# Patient Record
Sex: Male | Born: 1960 | Race: White | Hispanic: No | Marital: Single | Smoking: Current every day smoker
Health system: Southern US, Community
[De-identification: ages and names within clinical notes are randomized; demographics above are authoritative.]

## PROBLEM LIST (undated history)

## (undated) DIAGNOSIS — I519 Heart disease, unspecified: Secondary | ICD-10-CM

## (undated) DIAGNOSIS — Z951 Presence of aortocoronary bypass graft: Secondary | ICD-10-CM

## (undated) DIAGNOSIS — I2510 Atherosclerotic heart disease of native coronary artery without angina pectoris: Secondary | ICD-10-CM

## (undated) DIAGNOSIS — F1410 Cocaine abuse, uncomplicated: Secondary | ICD-10-CM

## (undated) HISTORY — PX: CORONARY STENT PLACEMENT: SHX1402

---

## 2005-04-12 ENCOUNTER — Inpatient Hospital Stay (HOSPITAL_COMMUNITY): Admission: EM | Admit: 2005-04-12 | Discharge: 2005-04-14 | Payer: Self-pay | Admitting: Emergency Medicine

## 2006-05-25 ENCOUNTER — Emergency Department (HOSPITAL_COMMUNITY): Admission: EM | Admit: 2006-05-25 | Discharge: 2006-05-26 | Payer: Self-pay | Admitting: Emergency Medicine

## 2006-08-17 ENCOUNTER — Ambulatory Visit: Payer: Self-pay | Admitting: Family Medicine

## 2006-09-07 ENCOUNTER — Ambulatory Visit: Payer: Self-pay | Admitting: Family Medicine

## 2006-09-08 ENCOUNTER — Ambulatory Visit: Payer: Self-pay | Admitting: *Deleted

## 2006-12-18 ENCOUNTER — Ambulatory Visit: Payer: Self-pay | Admitting: Family Medicine

## 2009-02-21 ENCOUNTER — Encounter: Payer: Self-pay | Admitting: Cardiothoracic Surgery

## 2009-02-21 ENCOUNTER — Emergency Department (HOSPITAL_COMMUNITY): Admission: EM | Admit: 2009-02-21 | Discharge: 2009-02-21 | Payer: Self-pay | Admitting: Family Medicine

## 2009-02-21 ENCOUNTER — Ambulatory Visit: Payer: Self-pay | Admitting: Cardiothoracic Surgery

## 2009-02-21 ENCOUNTER — Inpatient Hospital Stay (HOSPITAL_COMMUNITY): Admission: EM | Admit: 2009-02-21 | Discharge: 2009-02-28 | Payer: Self-pay | Admitting: Cardiology

## 2009-02-22 ENCOUNTER — Encounter: Payer: Self-pay | Admitting: Cardiothoracic Surgery

## 2009-03-16 ENCOUNTER — Ambulatory Visit: Payer: Self-pay | Admitting: Cardiothoracic Surgery

## 2009-03-16 ENCOUNTER — Encounter: Admission: RE | Admit: 2009-03-16 | Discharge: 2009-03-16 | Payer: Self-pay | Admitting: Cardiothoracic Surgery

## 2009-03-21 ENCOUNTER — Ambulatory Visit: Payer: Self-pay | Admitting: Internal Medicine

## 2009-04-05 ENCOUNTER — Encounter (INDEPENDENT_AMBULATORY_CARE_PROVIDER_SITE_OTHER): Payer: Self-pay | Admitting: Adult Health

## 2009-04-05 ENCOUNTER — Ambulatory Visit: Payer: Self-pay | Admitting: Internal Medicine

## 2009-04-05 LAB — CONVERTED CEMR LAB
ALT: 9 units/L (ref 0–53)
AST: 13 units/L (ref 0–37)
Albumin: 4.4 g/dL (ref 3.5–5.2)
Alkaline Phosphatase: 62 units/L (ref 39–117)
Basophils Absolute: 0 10*3/uL (ref 0.0–0.1)
HDL: 50 mg/dL (ref >39)
LDL Cholesterol: 58 mg/dL (ref 0–99)
Lymphocytes Relative: 22 % (ref 12–46)
Neutro Abs: 4.8 10*3/uL (ref 1.7–7.7)
Platelets: 301 10*3/uL (ref 150–400)
Potassium: 4.6 meq/L (ref 3.5–5.3)
RDW: 13.7 % (ref 11.5–15.5)
Sodium: 141 meq/L (ref 135–145)
Total Protein: 7.4 g/dL (ref 6.0–8.3)
WBC: 7.4 10*3/uL (ref 4.0–10.5)

## 2009-04-12 ENCOUNTER — Encounter (HOSPITAL_COMMUNITY): Admission: RE | Admit: 2009-04-12 | Discharge: 2009-04-27 | Payer: Self-pay | Admitting: Cardiology

## 2009-04-28 ENCOUNTER — Encounter (HOSPITAL_COMMUNITY): Admission: RE | Admit: 2009-04-28 | Discharge: 2009-07-27 | Payer: Self-pay | Admitting: Cardiology

## 2009-05-30 ENCOUNTER — Encounter (INDEPENDENT_AMBULATORY_CARE_PROVIDER_SITE_OTHER): Payer: Self-pay | Admitting: Cardiology

## 2009-05-30 ENCOUNTER — Ambulatory Visit (HOSPITAL_COMMUNITY): Admission: RE | Admit: 2009-05-30 | Discharge: 2009-05-30 | Payer: Self-pay | Admitting: Cardiology

## 2009-06-08 ENCOUNTER — Emergency Department (HOSPITAL_COMMUNITY)
Admission: EM | Admit: 2009-06-08 | Discharge: 2009-06-08 | Payer: Self-pay | Source: Home / Self Care | Admitting: Emergency Medicine

## 2009-06-21 ENCOUNTER — Ambulatory Visit: Payer: Self-pay | Admitting: Internal Medicine

## 2009-07-04 ENCOUNTER — Encounter: Admission: RE | Admit: 2009-07-04 | Discharge: 2009-10-02 | Payer: Self-pay | Admitting: Orthopedic Surgery

## 2009-08-22 ENCOUNTER — Encounter: Admission: RE | Admit: 2009-08-22 | Discharge: 2009-11-20 | Payer: Self-pay | Admitting: Orthopedic Surgery

## 2010-02-07 ENCOUNTER — Emergency Department (HOSPITAL_COMMUNITY): Admission: EM | Admit: 2010-02-07 | Discharge: 2010-02-07 | Payer: Self-pay | Admitting: Emergency Medicine

## 2010-07-11 LAB — RAPID URINE DRUG SCREEN, HOSP PERFORMED
Amphetamines: NOT DETECTED
Barbiturates: NOT DETECTED
Benzodiazepines: NOT DETECTED
Cocaine: POSITIVE — AB
Opiates: NOT DETECTED
Tetrahydrocannabinol: NOT DETECTED

## 2010-07-31 LAB — COMPREHENSIVE METABOLIC PANEL
ALT: 51 U/L (ref 0–53)
AST: 49 U/L — ABNORMAL HIGH (ref 0–37)
Albumin: 2.3 g/dL — ABNORMAL LOW (ref 3.5–5.2)
Alkaline Phosphatase: 85 U/L (ref 39–117)
BUN: 14 mg/dL (ref 6–23)
CO2: 29 mEq/L (ref 19–32)
Calcium: 7.9 mg/dL — ABNORMAL LOW (ref 8.4–10.5)
Chloride: 101 mEq/L (ref 96–112)
Creatinine, Ser: 0.79 mg/dL (ref 0.4–1.5)
GFR calc Af Amer: >60 mL/min (ref >60)
GFR calc non Af Amer: >60 mL/min (ref >60)
Glucose, Bld: 103 mg/dL — ABNORMAL HIGH (ref 70–99)
Potassium: 4.2 mEq/L (ref 3.5–5.1)
Sodium: 135 mEq/L (ref 135–145)
Total Bilirubin: 0.5 mg/dL (ref 0.3–1.2)
Total Protein: 5.5 g/dL — ABNORMAL LOW (ref 6.0–8.3)

## 2010-07-31 LAB — CBC
HCT: 26.5 % — ABNORMAL LOW (ref 39.0–52.0)
HCT: 26.8 % — ABNORMAL LOW (ref 39.0–52.0)
Hemoglobin: 9.2 g/dL — ABNORMAL LOW (ref 13.0–17.0)
Hemoglobin: 9.4 g/dL — ABNORMAL LOW (ref 13.0–17.0)
MCHC: 34.6 g/dL (ref 30.0–36.0)
MCHC: 34.8 g/dL (ref 30.0–36.0)
MCV: 89.5 fL (ref 78.0–100.0)
MCV: 90.3 fL (ref 78.0–100.0)
Platelets: 211 10*3/uL (ref 150–400)
Platelets: 250 10*3/uL (ref 150–400)
RBC: 2.96 MIL/uL — ABNORMAL LOW (ref 4.22–5.81)
RBC: 2.97 MIL/uL — ABNORMAL LOW (ref 4.22–5.81)
RDW: 13.5 % (ref 11.5–15.5)
RDW: 13.6 % (ref 11.5–15.5)
WBC: 10.6 10*3/uL — ABNORMAL HIGH (ref 4.0–10.5)
WBC: 9.9 10*3/uL (ref 4.0–10.5)

## 2010-07-31 LAB — BASIC METABOLIC PANEL
BUN: 15 mg/dL (ref 6–23)
CO2: 28 mEq/L (ref 19–32)
Calcium: 7.9 mg/dL — ABNORMAL LOW (ref 8.4–10.5)
Chloride: 102 mEq/L (ref 96–112)
Creatinine, Ser: 0.91 mg/dL (ref 0.4–1.5)
GFR calc Af Amer: >60 mL/min (ref >60)
GFR calc non Af Amer: >60 mL/min (ref >60)
Glucose, Bld: 107 mg/dL — ABNORMAL HIGH (ref 70–99)
Potassium: 3.7 mEq/L (ref 3.5–5.1)
Sodium: 137 mEq/L (ref 135–145)

## 2010-08-01 LAB — CARDIAC PANEL(CRET KIN+CKTOT+MB+TROPI)
CK, MB: 100.3 ng/mL — ABNORMAL HIGH (ref 0.3–4.0)
CK, MB: 233.5 ng/mL — ABNORMAL HIGH (ref 0.3–4.0)
CK, MB: 474.3 ng/mL — ABNORMAL HIGH (ref 0.3–4.0)
CK, MB: 73.1 ng/mL — ABNORMAL HIGH (ref 0.3–4.0)
Relative Index: 5.2 — ABNORMAL HIGH (ref 0.0–2.5)
Relative Index: 5.9 — ABNORMAL HIGH (ref 0.0–2.5)
Relative Index: 6.6 — ABNORMAL HIGH (ref 0.0–2.5)
Relative Index: 7.4 — ABNORMAL HIGH (ref 0.0–2.5)
Total CK: 1232 U/L — ABNORMAL HIGH (ref 7–232)
Total CK: 1921 U/L — ABNORMAL HIGH (ref 7–232)
Total CK: 3154 U/L — ABNORMAL HIGH (ref 7–232)
Total CK: 7207 U/L — ABNORMAL HIGH (ref 7–232)
Troponin I: 87.58 ng/mL (ref 0.00–0.06)
Troponin I: >100 ng/mL (ref 0.00–0.06)
Troponin I: >100 ng/mL (ref 0.00–0.06)
Troponin I: >101 ng/mL (ref 0.00–0.06)

## 2010-08-01 LAB — BASIC METABOLIC PANEL
BUN: 11 mg/dL (ref 6–23)
BUN: 12 mg/dL (ref 6–23)
BUN: 9 mg/dL (ref 6–23)
CO2: 22 mEq/L (ref 19–32)
CO2: 25 mEq/L (ref 19–32)
CO2: 29 mEq/L (ref 19–32)
Calcium: 7.1 mg/dL — ABNORMAL LOW (ref 8.4–10.5)
Calcium: 7.9 mg/dL — ABNORMAL LOW (ref 8.4–10.5)
Calcium: 8.2 mg/dL — ABNORMAL LOW (ref 8.4–10.5)
Chloride: 100 mEq/L (ref 96–112)
Chloride: 102 mEq/L (ref 96–112)
Chloride: 102 mEq/L (ref 96–112)
Creatinine, Ser: 0.67 mg/dL (ref 0.4–1.5)
Creatinine, Ser: 0.69 mg/dL (ref 0.4–1.5)
Creatinine, Ser: 0.71 mg/dL (ref 0.4–1.5)
GFR calc Af Amer: >60 mL/min (ref >60)
GFR calc Af Amer: >60 mL/min (ref >60)
GFR calc Af Amer: >60 mL/min (ref >60)
GFR calc non Af Amer: >60 mL/min (ref >60)
GFR calc non Af Amer: >60 mL/min (ref >60)
GFR calc non Af Amer: >60 mL/min (ref >60)
Glucose, Bld: 118 mg/dL — ABNORMAL HIGH (ref 70–99)
Glucose, Bld: 119 mg/dL — ABNORMAL HIGH (ref 70–99)
Glucose, Bld: 127 mg/dL — ABNORMAL HIGH (ref 70–99)
Potassium: 3.6 mEq/L (ref 3.5–5.1)
Potassium: 3.8 mEq/L (ref 3.5–5.1)
Potassium: 4.3 mEq/L (ref 3.5–5.1)
Sodium: 130 mEq/L — ABNORMAL LOW (ref 135–145)
Sodium: 133 mEq/L — ABNORMAL LOW (ref 135–145)
Sodium: 133 mEq/L — ABNORMAL LOW (ref 135–145)

## 2010-08-01 LAB — POCT I-STAT 3, ART BLOOD GAS (G3+)
Acid-Base Excess: 1 mmol/L (ref 0.0–2.0)
Acid-Base Excess: 1 mmol/L (ref 0.0–2.0)
Acid-Base Excess: 1 mmol/L (ref 0.0–2.0)
Bicarbonate: 23.5 mEq/L (ref 20.0–24.0)
Bicarbonate: 24.6 mEq/L — ABNORMAL HIGH (ref 20.0–24.0)
Bicarbonate: 25.3 mEq/L — ABNORMAL HIGH (ref 20.0–24.0)
Bicarbonate: 25.4 mEq/L — ABNORMAL HIGH (ref 20.0–24.0)
O2 Saturation: 100 %
O2 Saturation: 100 %
O2 Saturation: 98 %
O2 Saturation: 99 %
Patient temperature: 38.4
Patient temperature: 99.3
TCO2: 25 mmol/L (ref 0–100)
TCO2: 26 mmol/L (ref 0–100)
TCO2: 26 mmol/L (ref 0–100)
TCO2: 27 mmol/L (ref 0–100)
TCO2: 28 mmol/L (ref 0–100)
pCO2 arterial: 36.8 mmHg (ref 35.0–45.0)
pCO2 arterial: 37.4 mmHg (ref 35.0–45.0)
pCO2 arterial: 39.3 mmHg (ref 35.0–45.0)
pCO2 arterial: 44.5 mmHg (ref 35.0–45.0)
pCO2 arterial: 44.7 mmHg (ref 35.0–45.0)
pH, Arterial: 7.365 (ref 7.350–7.450)
pH, Arterial: 7.371 (ref 7.350–7.450)
pH, Arterial: 7.416 (ref 7.350–7.450)
pH, Arterial: 7.419 (ref 7.350–7.450)
pH, Arterial: 7.431 (ref 7.350–7.450)
pO2, Arterial: 109 mmHg — ABNORMAL HIGH (ref 80.0–100.0)
pO2, Arterial: 128 mmHg — ABNORMAL HIGH (ref 80.0–100.0)
pO2, Arterial: 237 mmHg — ABNORMAL HIGH (ref 80.0–100.0)
pO2, Arterial: 334 mmHg — ABNORMAL HIGH (ref 80.0–100.0)

## 2010-08-01 LAB — POCT I-STAT 4, (NA,K, GLUC, HGB,HCT)
Glucose, Bld: 108 mg/dL — ABNORMAL HIGH (ref 70–99)
Glucose, Bld: 110 mg/dL — ABNORMAL HIGH (ref 70–99)
Glucose, Bld: 112 mg/dL — ABNORMAL HIGH (ref 70–99)
Glucose, Bld: 117 mg/dL — ABNORMAL HIGH (ref 70–99)
Glucose, Bld: 146 mg/dL — ABNORMAL HIGH (ref 70–99)
Glucose, Bld: 95 mg/dL (ref 70–99)
HCT: 26 % — ABNORMAL LOW (ref 39.0–52.0)
HCT: 27 % — ABNORMAL LOW (ref 39.0–52.0)
HCT: 29 % — ABNORMAL LOW (ref 39.0–52.0)
HCT: 36 % — ABNORMAL LOW (ref 39.0–52.0)
HCT: 37 % — ABNORMAL LOW (ref 39.0–52.0)
Hemoglobin: 12.2 g/dL — ABNORMAL LOW (ref 13.0–17.0)
Hemoglobin: 12.6 g/dL — ABNORMAL LOW (ref 13.0–17.0)
Hemoglobin: 8.8 g/dL — ABNORMAL LOW (ref 13.0–17.0)
Hemoglobin: 8.8 g/dL — ABNORMAL LOW (ref 13.0–17.0)
Hemoglobin: 9.2 g/dL — ABNORMAL LOW (ref 13.0–17.0)
Potassium: 3.2 mEq/L — ABNORMAL LOW (ref 3.5–5.1)
Potassium: 3.7 mEq/L (ref 3.5–5.1)
Potassium: 3.8 mEq/L (ref 3.5–5.1)
Potassium: 3.9 mEq/L (ref 3.5–5.1)
Potassium: 3.9 mEq/L (ref 3.5–5.1)
Sodium: 135 mEq/L (ref 135–145)
Sodium: 135 mEq/L (ref 135–145)
Sodium: 136 mEq/L (ref 135–145)
Sodium: 136 mEq/L (ref 135–145)
Sodium: 136 mEq/L (ref 135–145)

## 2010-08-01 LAB — URINALYSIS, ROUTINE W REFLEX MICROSCOPIC
Bilirubin Urine: NEGATIVE
Glucose, UA: 100 mg/dL — AB
Ketones, ur: NEGATIVE mg/dL
Leukocytes, UA: NEGATIVE
Nitrite: NEGATIVE
Protein, ur: 30 mg/dL — AB
Specific Gravity, Urine: 1.039 — ABNORMAL HIGH (ref 1.005–1.030)
Urobilinogen, UA: 1 mg/dL (ref 0.0–1.0)
pH: 5.5 (ref 5.0–8.0)

## 2010-08-01 LAB — CREATININE, SERUM
Creatinine, Ser: 0.72 mg/dL (ref 0.4–1.5)
Creatinine, Ser: 0.74 mg/dL (ref 0.4–1.5)
GFR calc Af Amer: >60 mL/min (ref >60)
GFR calc Af Amer: >60 mL/min (ref >60)
GFR calc non Af Amer: >60 mL/min (ref >60)
GFR calc non Af Amer: >60 mL/min (ref >60)

## 2010-08-01 LAB — PREPARE FRESH FROZEN PLASMA

## 2010-08-01 LAB — HEPARIN LEVEL (UNFRACTIONATED)
Heparin Unfractionated: 0.1 IU/mL — ABNORMAL LOW (ref 0.30–0.70)
Heparin Unfractionated: 0.22 IU/mL — ABNORMAL LOW (ref 0.30–0.70)
Heparin Unfractionated: 0.48 IU/mL (ref 0.30–0.70)

## 2010-08-01 LAB — COMPREHENSIVE METABOLIC PANEL
ALT: 131 U/L — ABNORMAL HIGH (ref 0–53)
AST: 101 U/L — ABNORMAL HIGH (ref 0–37)
AST: 451 U/L — ABNORMAL HIGH (ref 0–37)
Albumin: 3.4 g/dL — ABNORMAL LOW (ref 3.5–5.2)
Alkaline Phosphatase: 58 U/L (ref 39–117)
Alkaline Phosphatase: 60 U/L (ref 39–117)
BUN: 10 mg/dL (ref 6–23)
BUN: 9 mg/dL (ref 6–23)
CO2: 21 mEq/L (ref 19–32)
CO2: 24 mEq/L (ref 19–32)
Calcium: 8.6 mg/dL (ref 8.4–10.5)
Chloride: 102 mEq/L (ref 96–112)
Chloride: 107 mEq/L (ref 96–112)
Creatinine, Ser: 0.59 mg/dL (ref 0.4–1.5)
Creatinine, Ser: 0.76 mg/dL (ref 0.4–1.5)
GFR calc Af Amer: >60 mL/min (ref >60)
GFR calc non Af Amer: >60 mL/min (ref >60)
GFR calc non Af Amer: >60 mL/min (ref >60)
Glucose, Bld: 148 mg/dL — ABNORMAL HIGH (ref 70–99)
Potassium: 3.7 mEq/L (ref 3.5–5.1)
Potassium: 3.7 mEq/L (ref 3.5–5.1)
Sodium: 134 mEq/L — ABNORMAL LOW (ref 135–145)
Total Bilirubin: 0.9 mg/dL (ref 0.3–1.2)
Total Bilirubin: 1 mg/dL (ref 0.3–1.2)
Total Protein: 6.3 g/dL (ref 6.0–8.3)

## 2010-08-01 LAB — HEMOGLOBIN AND HEMATOCRIT, BLOOD
HCT: 28.4 % — ABNORMAL LOW (ref 39.0–52.0)
Hemoglobin: 9.9 g/dL — ABNORMAL LOW (ref 13.0–17.0)

## 2010-08-01 LAB — DIFFERENTIAL
Basophils Absolute: 0 10*3/uL (ref 0.0–0.1)
Basophils Relative: 0 % (ref 0–1)
Eosinophils Absolute: 0 10*3/uL (ref 0.0–0.7)
Eosinophils Relative: 0 % (ref 0–5)
Lymphocytes Relative: 6 % — ABNORMAL LOW (ref 12–46)
Lymphs Abs: 0.9 10*3/uL (ref 0.7–4.0)
Monocytes Absolute: 1.6 10*3/uL — ABNORMAL HIGH (ref 0.1–1.0)
Monocytes Relative: 10 % (ref 3–12)
Neutro Abs: 13.3 10*3/uL — ABNORMAL HIGH (ref 1.7–7.7)
Neutrophils Relative %: 84 % — ABNORMAL HIGH (ref 43–77)

## 2010-08-01 LAB — POCT I-STAT, CHEM 8
BUN: 12 mg/dL (ref 6–23)
Calcium, Ion: 1.11 mmol/L — ABNORMAL LOW (ref 1.12–1.32)
Calcium, Ion: 1.15 mmol/L (ref 1.12–1.32)
Chloride: 104 mEq/L (ref 96–112)
Creatinine, Ser: 0.7 mg/dL (ref 0.4–1.5)
Creatinine, Ser: 0.7 mg/dL (ref 0.4–1.5)
Glucose, Bld: 156 mg/dL — ABNORMAL HIGH (ref 70–99)
HCT: 25 % — ABNORMAL LOW (ref 39.0–52.0)
HCT: 44 % (ref 39.0–52.0)
Hemoglobin: 15 g/dL (ref 13.0–17.0)
Hemoglobin: 8.5 g/dL — ABNORMAL LOW (ref 13.0–17.0)
Potassium: 4.3 mEq/L (ref 3.5–5.1)
Sodium: 132 mEq/L — ABNORMAL LOW (ref 135–145)
Sodium: 137 mEq/L (ref 135–145)
TCO2: 19 mmol/L (ref 0–100)
TCO2: 22 mmol/L (ref 0–100)

## 2010-08-01 LAB — GLUCOSE, CAPILLARY
Glucose-Capillary: 100 mg/dL — ABNORMAL HIGH (ref 70–99)
Glucose-Capillary: 107 mg/dL — ABNORMAL HIGH (ref 70–99)
Glucose-Capillary: 108 mg/dL — ABNORMAL HIGH (ref 70–99)
Glucose-Capillary: 92 mg/dL (ref 70–99)

## 2010-08-01 LAB — LIPID PANEL
Cholesterol: 144 mg/dL (ref 0–200)
HDL: 70 mg/dL (ref >39)
LDL Cholesterol: 67 mg/dL (ref 0–99)
Total CHOL/HDL Ratio: 2.1 RATIO
Triglycerides: 34 mg/dL (ref <150)
VLDL: 7 mg/dL (ref 0–40)

## 2010-08-01 LAB — CBC
HCT: 24.5 % — ABNORMAL LOW (ref 39.0–52.0)
HCT: 25.7 % — ABNORMAL LOW (ref 39.0–52.0)
HCT: 25.8 % — ABNORMAL LOW (ref 39.0–52.0)
HCT: 27.1 % — ABNORMAL LOW (ref 39.0–52.0)
HCT: 28.7 % — ABNORMAL LOW (ref 39.0–52.0)
HCT: 39.4 % (ref 39.0–52.0)
HCT: 41.3 % (ref 39.0–52.0)
HCT: 42.6 % (ref 39.0–52.0)
HCT: 43.1 % (ref 39.0–52.0)
Hemoglobin: 10 g/dL — ABNORMAL LOW (ref 13.0–17.0)
Hemoglobin: 13.5 g/dL (ref 13.0–17.0)
Hemoglobin: 14.6 g/dL (ref 13.0–17.0)
Hemoglobin: 14.6 g/dL (ref 13.0–17.0)
Hemoglobin: 8.4 g/dL — ABNORMAL LOW (ref 13.0–17.0)
Hemoglobin: 8.8 g/dL — ABNORMAL LOW (ref 13.0–17.0)
Hemoglobin: 8.9 g/dL — ABNORMAL LOW (ref 13.0–17.0)
Hemoglobin: 9.6 g/dL — ABNORMAL LOW (ref 13.0–17.0)
MCHC: 33.9 g/dL (ref 30.0–36.0)
MCHC: 34.2 g/dL (ref 30.0–36.0)
MCHC: 34.2 g/dL (ref 30.0–36.0)
MCHC: 34.3 g/dL (ref 30.0–36.0)
MCHC: 34.3 g/dL (ref 30.0–36.0)
MCHC: 34.7 g/dL (ref 30.0–36.0)
MCHC: 34.8 g/dL (ref 30.0–36.0)
MCHC: 35.6 g/dL (ref 30.0–36.0)
MCV: 88.9 fL (ref 78.0–100.0)
MCV: 89.2 fL (ref 78.0–100.0)
MCV: 89.6 fL (ref 78.0–100.0)
MCV: 89.6 fL (ref 78.0–100.0)
MCV: 89.6 fL (ref 78.0–100.0)
MCV: 89.7 fL (ref 78.0–100.0)
MCV: 90 fL (ref 78.0–100.0)
MCV: 90.2 fL (ref 78.0–100.0)
MCV: 90.6 fL (ref 78.0–100.0)
Platelets: 176 10*3/uL (ref 150–400)
Platelets: 187 10*3/uL (ref 150–400)
Platelets: 190 10*3/uL (ref 150–400)
Platelets: 195 10*3/uL (ref 150–400)
Platelets: 198 10*3/uL (ref 150–400)
Platelets: 235 10*3/uL (ref 150–400)
Platelets: 235 10*3/uL (ref 150–400)
Platelets: 241 10*3/uL (ref 150–400)
Platelets: 258 10*3/uL (ref 150–400)
RBC: 2.73 MIL/uL — ABNORMAL LOW (ref 4.22–5.81)
RBC: 2.86 MIL/uL — ABNORMAL LOW (ref 4.22–5.81)
RBC: 2.87 MIL/uL — ABNORMAL LOW (ref 4.22–5.81)
RBC: 3.04 MIL/uL — ABNORMAL LOW (ref 4.22–5.81)
RBC: 3.2 MIL/uL — ABNORMAL LOW (ref 4.22–5.81)
RBC: 4.37 MIL/uL (ref 4.22–5.81)
RBC: 4.76 MIL/uL (ref 4.22–5.81)
RBC: 4.77 MIL/uL (ref 4.22–5.81)
RDW: 13.4 % (ref 11.5–15.5)
RDW: 13.5 % (ref 11.5–15.5)
RDW: 13.6 % (ref 11.5–15.5)
RDW: 13.8 % (ref 11.5–15.5)
RDW: 13.8 % (ref 11.5–15.5)
RDW: 13.8 % (ref 11.5–15.5)
RDW: 13.9 % (ref 11.5–15.5)
RDW: 14 % (ref 11.5–15.5)
RDW: 14 % (ref 11.5–15.5)
WBC: 10.1 10*3/uL (ref 4.0–10.5)
WBC: 11.5 10*3/uL — ABNORMAL HIGH (ref 4.0–10.5)
WBC: 14.2 10*3/uL — ABNORMAL HIGH (ref 4.0–10.5)
WBC: 14.3 10*3/uL — ABNORMAL HIGH (ref 4.0–10.5)
WBC: 15.7 10*3/uL — ABNORMAL HIGH (ref 4.0–10.5)
WBC: 15.9 10*3/uL — ABNORMAL HIGH (ref 4.0–10.5)
WBC: 9.6 10*3/uL (ref 4.0–10.5)
WBC: 9.9 10*3/uL (ref 4.0–10.5)

## 2010-08-01 LAB — CROSSMATCH

## 2010-08-01 LAB — PREPARE PLATELETS

## 2010-08-01 LAB — MAGNESIUM
Magnesium: 1.9 mg/dL (ref 1.5–2.5)
Magnesium: 2.1 mg/dL (ref 1.5–2.5)
Magnesium: 2.3 mg/dL (ref 1.5–2.5)
Magnesium: 2.5 mg/dL (ref 1.5–2.5)

## 2010-08-01 LAB — URINE MICROSCOPIC-ADD ON

## 2010-08-01 LAB — MRSA PCR SCREENING: MRSA by PCR: NEGATIVE

## 2010-08-01 LAB — RAPID URINE DRUG SCREEN, HOSP PERFORMED
Amphetamines: NOT DETECTED
Barbiturates: NOT DETECTED
Benzodiazepines: NOT DETECTED
Cocaine: NOT DETECTED
Opiates: NOT DETECTED
Tetrahydrocannabinol: NOT DETECTED

## 2010-08-01 LAB — APTT
aPTT: 40 seconds — ABNORMAL HIGH (ref 24–37)
aPTT: 79 seconds — ABNORMAL HIGH (ref 24–37)
aPTT: >200 seconds (ref 24–37)

## 2010-08-01 LAB — PROTIME-INR
INR: 1.22 (ref 0.00–1.49)
INR: 1.29 (ref 0.00–1.49)
INR: 1.57 — ABNORMAL HIGH (ref 0.00–1.49)
Prothrombin Time: 15.3 seconds — ABNORMAL HIGH (ref 11.6–15.2)
Prothrombin Time: 18.6 seconds — ABNORMAL HIGH (ref 11.6–15.2)

## 2010-08-01 LAB — ABO/RH: ABO/RH(D): O NEG

## 2010-08-01 LAB — HEPATITIS PANEL, ACUTE
HCV Ab: NEGATIVE
Hep A IgM: NEGATIVE
Hep B C IgM: NEGATIVE
Hepatitis B Surface Ag: NEGATIVE

## 2010-08-01 LAB — HEMOGLOBIN A1C
Hgb A1c MFr Bld: 5.3 % (ref 4.6–6.1)
Mean Plasma Glucose: 105 mg/dL

## 2010-08-01 LAB — PLATELET COUNT: Platelets: 137 10*3/uL — ABNORMAL LOW (ref 150–400)

## 2010-08-01 LAB — TSH: TSH: 3.867 u[IU]/mL (ref 0.350–4.500)

## 2010-08-01 LAB — BRAIN NATRIURETIC PEPTIDE: Pro B Natriuretic peptide (BNP): 361 pg/mL — ABNORMAL HIGH (ref 0.0–100.0)

## 2010-09-10 NOTE — Assessment & Plan Note (Signed)
OFFICE VISIT   [name], [name]  DOB:  [birth year]                                        March 16, 2009  CHART #:  [id number]   CURRENT PROBLEMS:  1. Status post coronary artery bypass graft x4, February 23, 2009.  2. Ischemic cardiomyopathy status post old anterior myocardial      infarction with ejection fraction of 20%.   PRESENT ILLNESS:  The patient is a 50 year old ex-smoker who returns for  his first postoperative followup after undergoing multivessel bypass  grafting after presented with an acute MI and underwent bare-metal  stenting of a occluded LAD by Dr. Clarene Duke.  Unfortunately, his troponins  peaked at a very high level, despite good result with the LAD stent.  He  had severe three-vessel disease as well and he underwent bypass grafting  with a mammary graft to the LAD and vein grafts placed to the circumflex  marginal, distal RCA, and the ramus intermediate.  Fortunately, he has  done well since surgery, remaining in the sinus rhythm.  He has been on  Lotensin, digoxin, Toprol-XL, and Lopressor 25 b.i.d. as well as aspirin  and Plavix.  He has had no recurrent angina or symptoms of CHF and the  surgical incisions are healing well.  He is walking and is scheduled to  begin outpatient rehab.  He is establishing care with [facility].  He  has not been smoking or using illicit drugs.   PHYSICAL EXAMINATION:  Vital Signs:  Blood pressure 110/70, pulse 80 and  regular, respirations 18, and saturation 100% on room air.  Lungs:  Breath sounds are clear and equal.  His sternum is stable and well  healed.  Cardiac:  Rhythm is regular and there is no S3 gallop or  murmur.  Extremities are without edema and the leg incision is well  healed.   PA and lateral chest x-ray reveals somewhat overinflated lungs, but  clear.  There is slight blunting of left costophrenic angle from a small  pleural effusion.  The sternal wires are all intact.   PLAN:  The  patient will be able to resume driving and light activities,  but he knows to avoid lifting more than 20 pounds until May 29, 2008.  He will continue his current medications.  He has not requested  anymore pain medication.  He was given copies of his medical records.  He can present to his primary physician at [facility] at his initial  appointment next week.  He will return as needed.   [name], M.D.  Electronically Signed   PV/MEDQ  D:  03/16/2009  T:  03/17/2009  Job:  [id number]   cc:   [name], M.D.

## 2011-01-15 ENCOUNTER — Inpatient Hospital Stay (INDEPENDENT_AMBULATORY_CARE_PROVIDER_SITE_OTHER)
Admission: RE | Admit: 2011-01-15 | Discharge: 2011-01-15 | Disposition: A | Discharge status: Home / Self Care | Payer: Self-pay | Source: Ambulatory Visit | Attending: Emergency Medicine | Admitting: Emergency Medicine

## 2011-01-15 DIAGNOSIS — J019 Acute sinusitis, unspecified: Secondary | ICD-10-CM

## 2011-02-01 IMAGING — CR DG CHEST 2V
2 series · 2 of 2 positions shown · non-contrast
Comparison: 02/26/2009

CLINICAL DATA: CABG.

CHEST - 2 VIEW

[w chest pa]
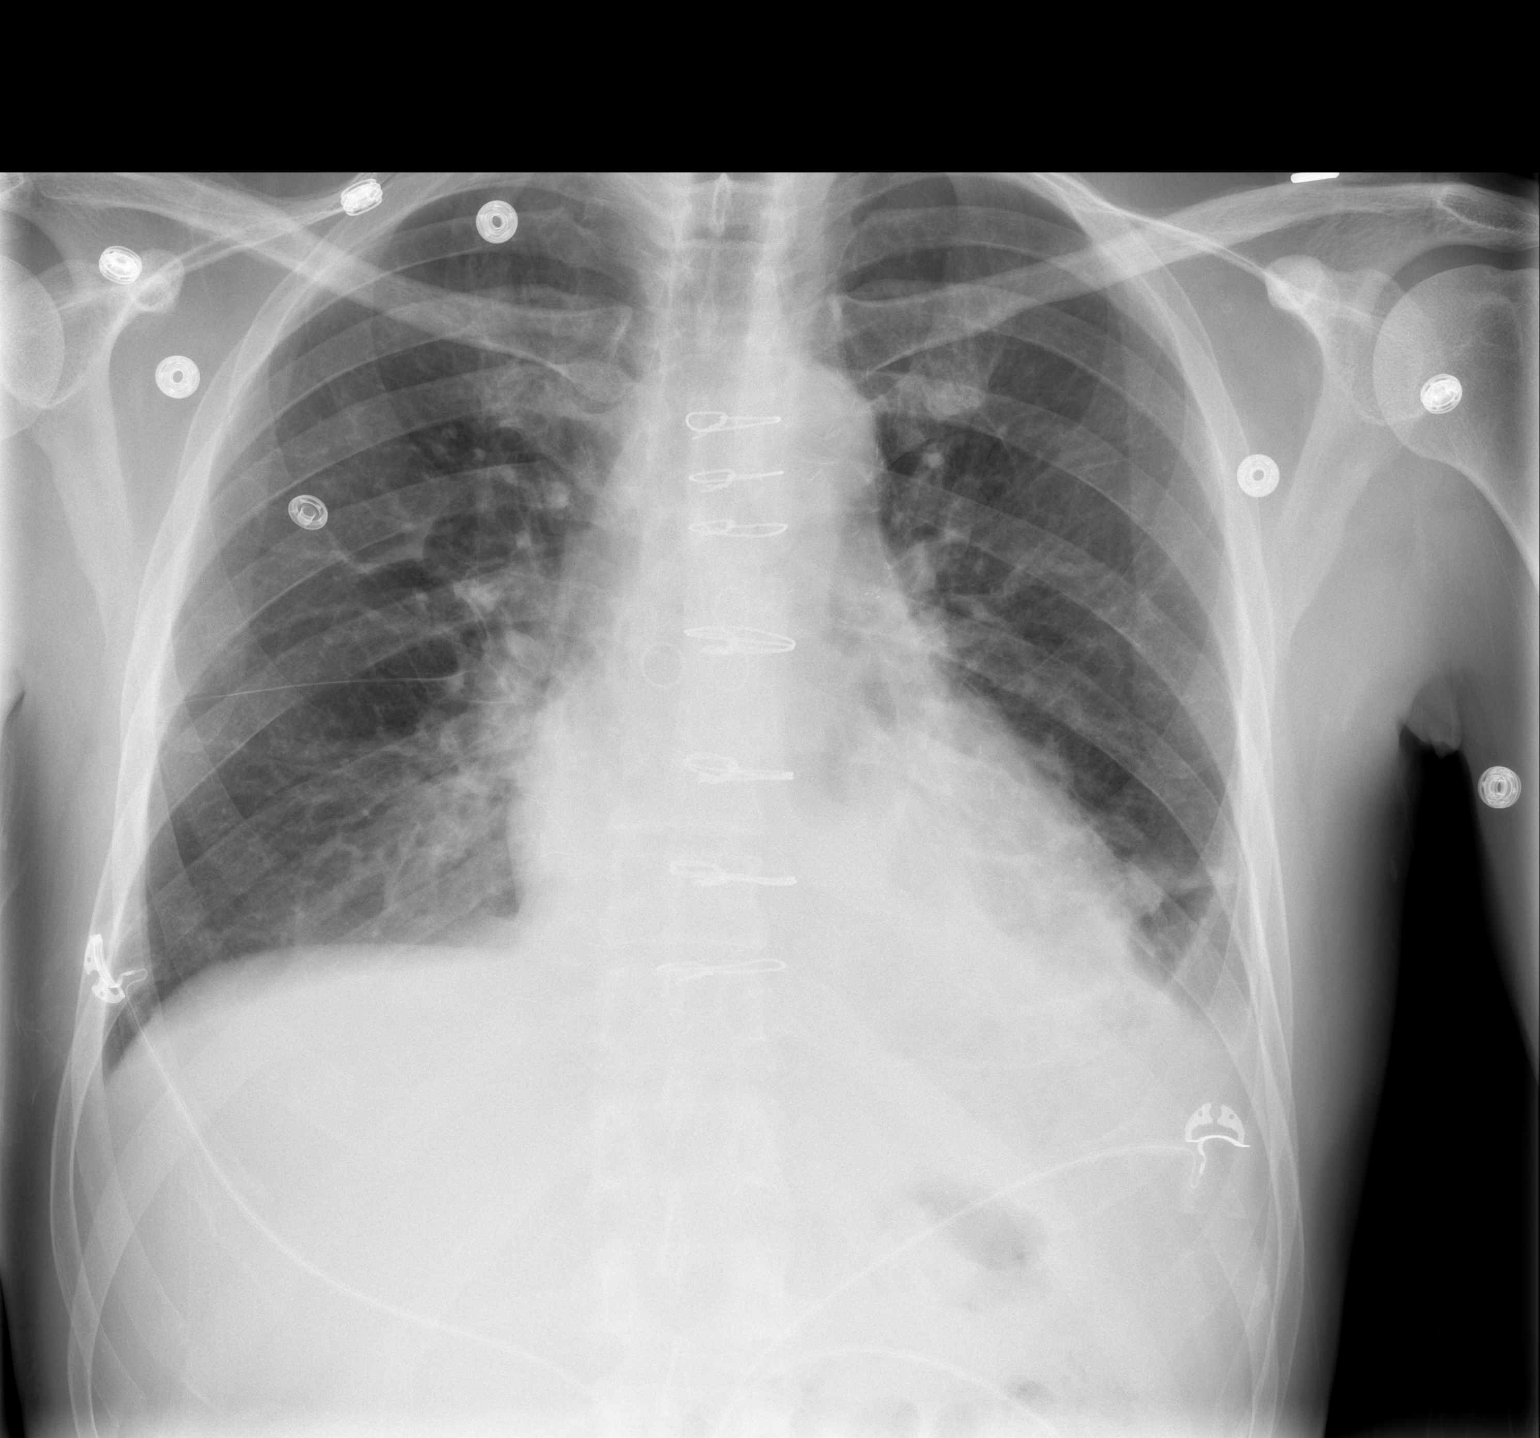

[w chest lat]
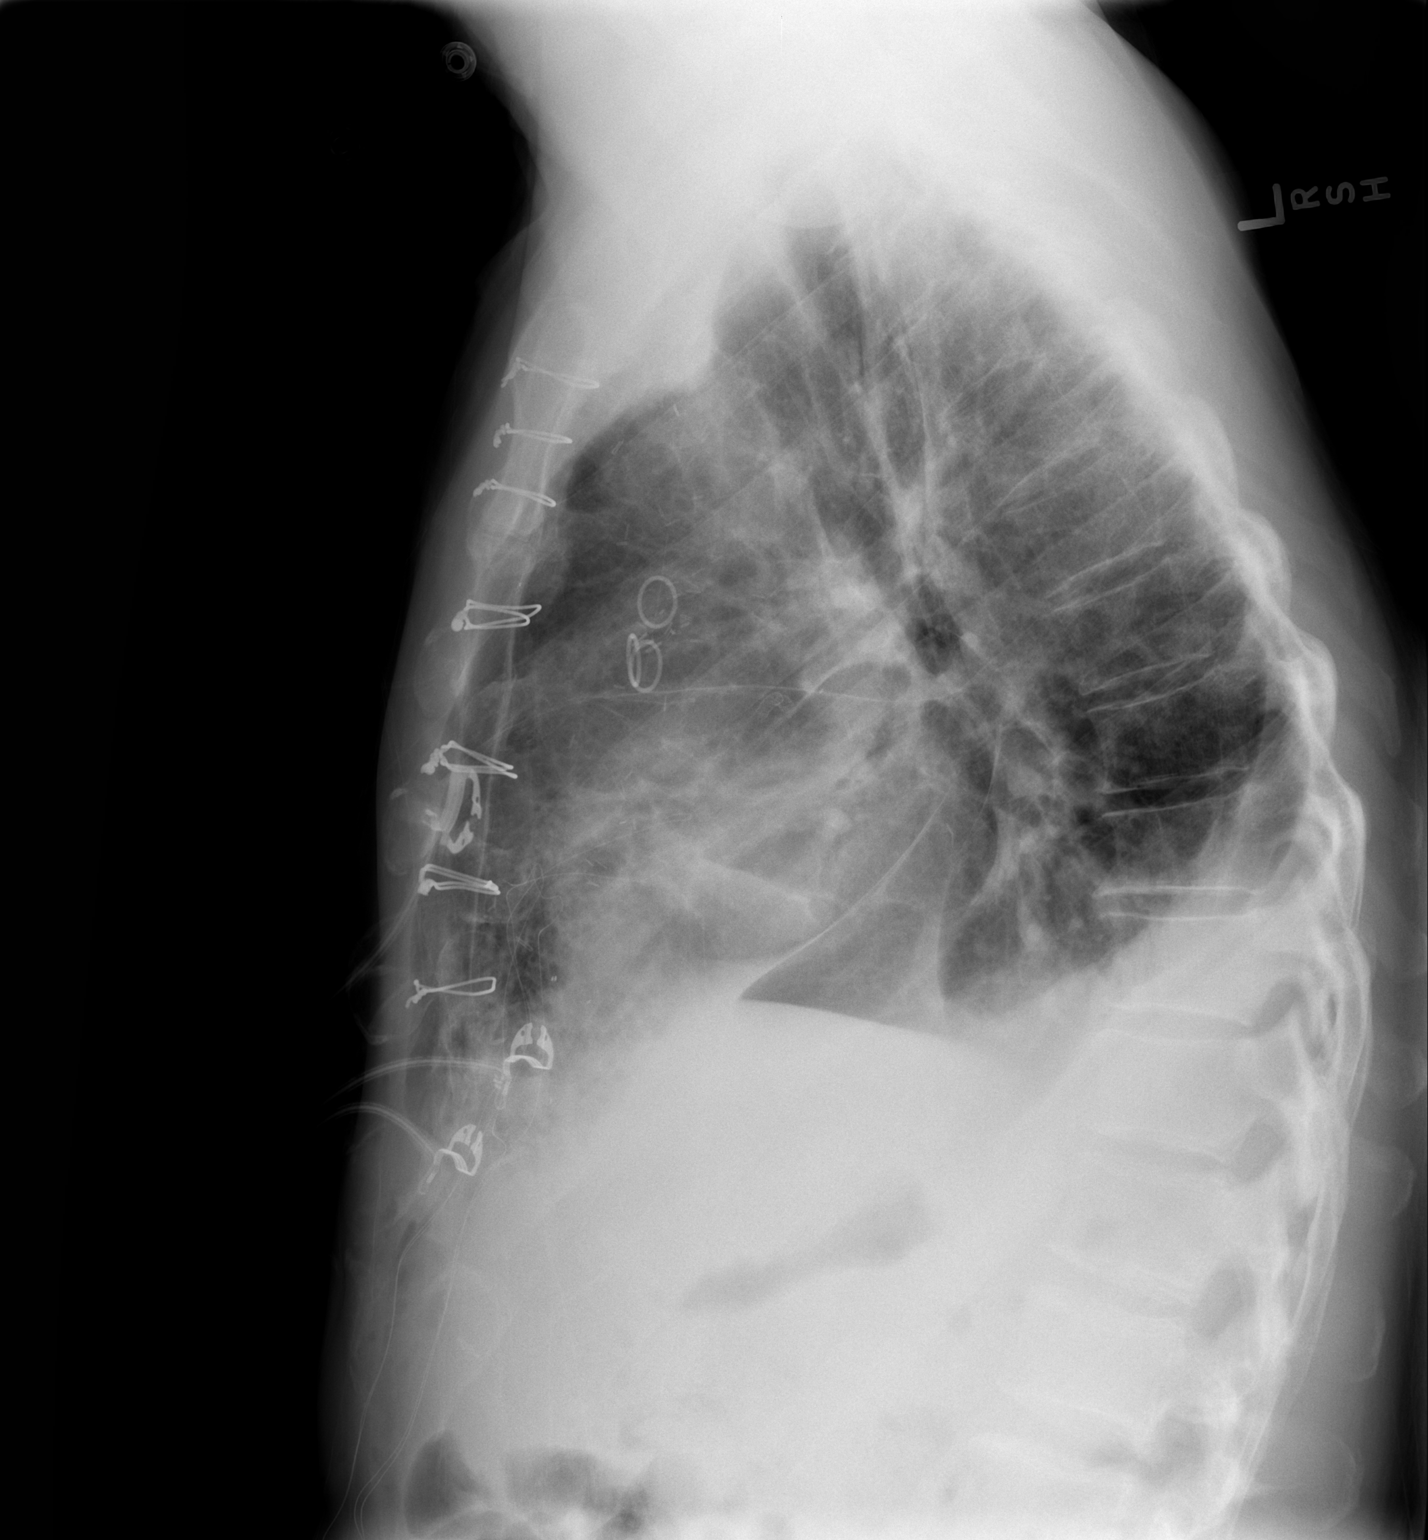

[2 of 2 positions shown; findings below may reference images not displayed]

FINDINGS: Bilateral pleural effusions.  Atelectatic changes at the
left base.  No significant interval change.
IMPRESSION: Left base atelectasis and pleural effusions persist.

## 2011-05-04 ENCOUNTER — Encounter: Payer: Self-pay | Admitting: Nurse Practitioner

## 2011-05-04 ENCOUNTER — Emergency Department (HOSPITAL_COMMUNITY)
Admission: EM | Admit: 2011-05-04 | Discharge: 2011-05-04 | Disposition: A | Discharge status: Home / Self Care | Payer: Self-pay | Attending: Emergency Medicine | Admitting: Emergency Medicine

## 2011-05-04 DIAGNOSIS — I10 Essential (primary) hypertension: Secondary | ICD-10-CM | POA: Insufficient documentation

## 2011-05-04 DIAGNOSIS — I519 Heart disease, unspecified: Secondary | ICD-10-CM | POA: Insufficient documentation

## 2011-05-04 DIAGNOSIS — I2510 Atherosclerotic heart disease of native coronary artery without angina pectoris: Secondary | ICD-10-CM | POA: Insufficient documentation

## 2011-05-04 DIAGNOSIS — G40909 Epilepsy, unspecified, not intractable, without status epilepticus: Secondary | ICD-10-CM | POA: Insufficient documentation

## 2011-05-04 DIAGNOSIS — K219 Gastro-esophageal reflux disease without esophagitis: Secondary | ICD-10-CM | POA: Insufficient documentation

## 2011-05-04 DIAGNOSIS — R51 Headache: Secondary | ICD-10-CM | POA: Insufficient documentation

## 2011-05-04 DIAGNOSIS — Z7982 Long term (current) use of aspirin: Secondary | ICD-10-CM | POA: Insufficient documentation

## 2011-05-04 DIAGNOSIS — Z79899 Other long term (current) drug therapy: Secondary | ICD-10-CM | POA: Insufficient documentation

## 2011-05-04 DIAGNOSIS — J329 Chronic sinusitis, unspecified: Secondary | ICD-10-CM | POA: Insufficient documentation

## 2011-05-04 DIAGNOSIS — J3489 Other specified disorders of nose and nasal sinuses: Secondary | ICD-10-CM | POA: Insufficient documentation

## 2011-05-04 DIAGNOSIS — E785 Hyperlipidemia, unspecified: Secondary | ICD-10-CM | POA: Insufficient documentation

## 2011-05-04 HISTORY — DX: Heart disease, unspecified: I51.9

## 2011-05-04 MED ORDER — LORATADINE 10 MG PO TABS: 10.0000 mg | ORAL_TABLET | Freq: Every day | ORAL | Status: DC

## 2011-05-04 MED ORDER — AMOXICILLIN 500 MG PO TABS: 1000.0000 mg | ORAL_TABLET | Freq: Two times a day (BID) | ORAL | Status: DC

## 2011-05-04 MED ORDER — FLUTICASONE PROPIONATE 50 MCG/ACT NA SUSP: 2.0000 | Freq: Every day | NASAL | Status: DC

## 2011-05-04 NOTE — ED Provider Notes (Signed)
History     CSN: [id number]  Arrival date & time 05/04/11  1107   First MD Initiated Contact with Patient 05/04/11 1140      Chief Complaint  Patient presents with  . Sinusitis    (Consider location/radiation/quality/duration/timing/severity/associated sxs/prior treatment) HPI This 51 year old male has history of recurrent sinusitis, he states he has chronic nasal congestion, over the last couple weeks he has left maxillary localized sinus congestion with rhinorrhea mostly from the left nostril with slight discomfort to that area only his right maxillary region and is for head feel fine without pain or pressure, he just feels the pressure of the left maxillary sinus region only. He does have intermittent congestion and rhinorrhea from the right nostril but consistent congestion and rhinorrhea from the left nostril. He is no fever headache confusion rash shortness of breath sore throat myalgias vomiting or diarrhea. Past Medical History  Diagnosis Date  . Heart disease    Coronary artery disease, hypertension, seizures, gastroesophageal reflux, substance abuse with former cocaine use, hyperlipidemia Past Surgical History  Procedure Date  . Coronary stent placement     History reviewed. No pertinent family history.  History  Substance Use Topics  . Smoking status: Never Smoker   . Smokeless tobacco: Not on file  . Alcohol Use: No   smokes tobacco, former cocaine use    Review of Systems  Constitutional: Negative for fever.       10 Systems reviewed and are negative for acute change except as noted in the HPI.  HENT: Positive for congestion and rhinorrhea.   Eyes: Negative for discharge and redness.  Respiratory: Negative for cough and shortness of breath.   Cardiovascular: Negative for chest pain.  Gastrointestinal: Negative for vomiting and abdominal pain.  Musculoskeletal: Negative for back pain.  Skin: Negative for rash.  Neurological: Negative for syncope, numbness  and headaches.  Psychiatric/Behavioral:       No behavior change.    Allergies  Review of patient's allergies indicates no known allergies.  Home Medications   Current Outpatient Rx  Name Route Sig Dispense Refill  . ASPIRIN EC 81 MG PO TBEC Oral Take 81 mg by mouth daily.      Marland Kitchen DIGOXIN 0.125 MG PO TABS Oral Take 125 mcg by mouth daily.      Marland Kitchen LISINOPRIL 5 MG PO TABS Oral Take 5 mg by mouth daily.      Marland Kitchen METOPROLOL TARTRATE 25 MG PO TABS Oral Take 25 mg by mouth 2 (two) times daily.      Marland Kitchen PRAVASTATIN SODIUM 20 MG PO TABS Oral Take 20 mg by mouth at bedtime.      . AMOXICILLIN 500 MG PO TABS Oral Take 2 tablets (1,000 mg total) by mouth 2 (two) times daily. 28 tablet 0  . FLUTICASONE PROPIONATE 50 MCG/ACT NA SUSP Nasal Place 2 sprays into the nose daily. 16 g 0  . LORATADINE 10 MG PO TABS Oral Take 1 tablet (10 mg total) by mouth daily. 5 tablet 0    BP 114/76  Pulse 83  Temp(Src) 98.5 F (36.9 C) (Oral)  Resp 17  SpO2 100%  Physical Exam  Nursing note and vitals reviewed. Constitutional:       Awake, alert, nontoxic appearance.  HENT:  Head: Atraumatic.  Mouth/Throat: Oropharynx is clear and moist. No oropharyngeal exudate.       Right naris mildly congestion with mild clear rhinorrhea left naris quite congested with copious rhinorrhea although still relatively clear; no  maxillary cheek erythema or induration  Eyes: Conjunctivae are normal. Pupils are equal, round, and reactive to light. Right eye exhibits no discharge. Left eye exhibits no discharge.  Neck: Neck supple.  Pulmonary/Chest: Effort normal. He exhibits no tenderness.  Abdominal: Soft. There is no tenderness. There is no rebound.  Musculoskeletal: He exhibits no tenderness.       Baseline ROM, no obvious new focal weakness.  Neurological:       Mental status and motor strength appears baseline for patient and situation.  Skin: No rash noted.  Psychiatric: He has a normal mood and affect.    ED Course    Procedures (including critical care time)  Labs Reviewed - No data to display No results found.   1. Sinusitis       MDM          Hurman Horn, MD 05/04/11 2139

## 2011-05-04 NOTE — ED Notes (Signed)
C/o congestion and runny nose over past several weeks, has been treated for sinus infection but symptoms persist

## 2011-05-13 ENCOUNTER — Encounter (HOSPITAL_COMMUNITY): Payer: Self-pay | Admitting: *Deleted

## 2011-05-13 ENCOUNTER — Emergency Department (HOSPITAL_COMMUNITY)
Admission: EM | Admit: 2011-05-13 | Discharge: 2011-05-13 | Disposition: A | Discharge status: Home / Self Care | Payer: Self-pay | Attending: Emergency Medicine | Admitting: Emergency Medicine

## 2011-05-13 DIAGNOSIS — J3489 Other specified disorders of nose and nasal sinuses: Secondary | ICD-10-CM | POA: Insufficient documentation

## 2011-05-13 DIAGNOSIS — I519 Heart disease, unspecified: Secondary | ICD-10-CM | POA: Insufficient documentation

## 2011-05-13 DIAGNOSIS — Z7982 Long term (current) use of aspirin: Secondary | ICD-10-CM | POA: Insufficient documentation

## 2011-05-13 DIAGNOSIS — J329 Chronic sinusitis, unspecified: Secondary | ICD-10-CM | POA: Insufficient documentation

## 2011-05-13 DIAGNOSIS — Z79899 Other long term (current) drug therapy: Secondary | ICD-10-CM | POA: Insufficient documentation

## 2011-05-13 DIAGNOSIS — R6883 Chills (without fever): Secondary | ICD-10-CM | POA: Insufficient documentation

## 2011-05-13 NOTE — ED Provider Notes (Signed)
History     CSN: [id number]  Arrival date & time 05/13/11  1144   First MD Initiated Contact with Patient 05/13/11 1258      Chief Complaint  Patient presents with  . Sinusitis     Patient is a 51 y.o. male presenting with sinusitis. The history is provided by the patient.  Sinusitis  This is a chronic problem. Episode onset: several weeks ago. The problem has been gradually worsening. There has been no fever. The pain is mild. Associated symptoms include chills and sinus pressure.  pt reports recent h/o sinusitis, finished amoxicillin, but still has rhinorrhea, mostly from left nare No visual complaints reported Past Medical History  Diagnosis Date  . Heart disease     Past Surgical History  Procedure Date  . Coronary stent placement     No family history on file.  History  Substance Use Topics  . Smoking status: Current Everyday Smoker  . Smokeless tobacco: Not on file  . Alcohol Use: No      Review of Systems  Constitutional: Positive for chills.  HENT: Positive for sinus pressure.     Allergies  Review of patient's allergies indicates no known allergies.  Home Medications   Current Outpatient Rx  Name Route Sig Dispense Refill  . AMOXICILLIN 500 MG PO CAPS Oral Take 1,000 mg by mouth 2 (two) times daily.    . ASPIRIN EC 81 MG PO TBEC Oral Take 81 mg by mouth daily.      Marland Kitchen DIGOXIN 0.125 MG PO TABS Oral Take 125 mcg by mouth daily.      Marland Kitchen FLUTICASONE PROPIONATE 50 MCG/ACT NA SUSP Nasal Place 2 sprays into the nose daily.    Marland Kitchen LISINOPRIL 5 MG PO TABS Oral Take 5 mg by mouth daily.      Marland Kitchen LORATADINE 10 MG PO TABS Oral Take 10 mg by mouth daily.    Marland Kitchen METOPROLOL TARTRATE 25 MG PO TABS Oral Take 25 mg by mouth 2 (two) times daily.      Marland Kitchen PRAVASTATIN SODIUM 20 MG PO TABS Oral Take 20 mg by mouth at bedtime.        BP 114/74  Pulse 75  Temp(Src) 97.9 F (36.6 C) (Oral)  Resp 18  SpO2 97%  Physical Exam CONSTITUTIONAL: Well developed/well  nourished HEAD AND FACE: Normocephalic/atraumatic EYES: EOMI/PERRL ENMT: Mucous membranes moist, nasal congestion, edematous mucosa to left nare, no blood, uvual midline, pharynx normal NECK: supple no meningeal signs CV: S1/S2 noted, no murmurs/rubs/gallops noted LUNGS: Lungs are clear to auscultation bilaterally, no apparent distress ABDOMEN: soft, nontender, no rebound or guarding NEURO: Pt is awake/alert, moves all extremitiesx4 EXTREMITIES: pulses normal, full ROM SKIN: warm, color normal PSYCH: no abnormalities of mood noted  ED Course  Procedures     1. Sinusitis       MDM  Nursing notes reviewed and considered in documentation Previous records reviewed and considered         Joya Gaskins, MD 05/13/11 [phone]

## 2011-05-13 NOTE — ED Notes (Signed)
Pt is here with recurrent sinus infection and finished last anbx on sunday

## 2011-05-13 NOTE — ED Notes (Signed)
Discharge instructions reviewed; verbalizes understanding.  No questions asked; no further c/o's voiced.  Pt ambulatory to lobby. 

## 2015-03-30 DIAGNOSIS — I10 Essential (primary) hypertension: Secondary | ICD-10-CM

## 2015-03-30 DIAGNOSIS — E039 Hypothyroidism, unspecified: Secondary | ICD-10-CM

## 2015-03-30 NOTE — Congregational Nurse Program (Signed)
03/30/15 - 1000 - Client came to see MH CN regarding [facility].  [facility] services through outpatient walk-in clinic.  Copy of referral filed.  Alphonse GuildBeth Zamoria Boss, MSN, RN

## 2015-04-03 DIAGNOSIS — I10 Essential (primary) hypertension: Secondary | ICD-10-CM

## 2015-04-09 NOTE — Congregational Nurse Program (Signed)
Congregational Nurse Program Note  Date of Encounter: 04/03/2015  Past Medical History: Past Medical History  Diagnosis Date  . Heart disease     Encounter Details:     CNP Questionnaire - 04/03/15 1216    Patient Demographics   Is this a new or existing patient? Existing   Patient is considered a/an Not Applicable   Patient Assistance   Patient's financial/insurance status Low Income;Uninsured   Patient referred to apply for the following financial assistance [employer]   Food insecurities addressed Provided food supplies   Transportation assistance No   Assistance securing medications No   Educational health offerings Not Applicable   Encounter Details   Primary purpose of visit Chronic Illness/Condition Visit   Was an Emergency Department visit averted? Not Applicable   Does patient have a medical provider? Yes   Patient referred to Not Applicable   Was a mental health screening completed? (GAINS tool) No   Was a mental health referral made? No   Does patient have dental issues? No   Since previous encounter, have you referred patient for abnormal blood pressure that resulted in a new diagnosis or medication change? No   Since previous encounter, have you referred patient for abnormal blood glucose that resulted in a new diagnosis or medication change? No   For Abstraction Use Only   Does patient have insurance? No     Clinic visit for B/P check.  [facility]

## 2015-04-24 DIAGNOSIS — Z139 Encounter for screening, unspecified: Secondary | ICD-10-CM

## 2015-05-06 NOTE — Congregational Nurse Program (Signed)
Congregational Nurse Program Note  Date of Encounter: 04/24/2015  Past Medical History: Past Medical History  Diagnosis Date  . Heart disease     Encounter Details:     CNP Questionnaire - 04/24/15 1304    Patient Demographics   Is this a new or existing patient? New   Patient is considered a/an Not Applicable   Race African-American/Black   Patient Assistance   Location of Patient Assistance Not Applicable   Patient's financial/insurance status Low Income;[employer]/Care Connects   Uninsured Patient Yes   Interventions Counseled to make appt. with provider   Patient referred to apply for the following financial assistance Not Applicable   Food insecurities addressed Provided food supplies   Transportation assistance No   Assistance securing medications No   Educational health offerings Chronic disease;Hypertension   Encounter Details   Primary purpose of visit Education/Health Concerns   Was an Emergency Department visit averted? Not Applicable   Does patient have a medical provider? Yes   Patient referred to Not Applicable   Was a mental health screening completed? (GAINS tool) No   Was a mental health referral made? No   Does patient have dental issues? No   Since previous encounter, have you referred patient for abnormal blood pressure that resulted in a new diagnosis or medication change? No   Since previous encounter, have you referred patient for abnormal blood glucose that resulted in a new diagnosis or medication change? No   For Abstraction Use Only   Does patient have insurance? No       Clinic visit requesting B/P check.  [facility]

## 2015-06-12 DIAGNOSIS — Z139 Encounter for screening, unspecified: Secondary | ICD-10-CM

## 2015-06-20 NOTE — Congregational Nurse Program (Signed)
Congregational Nurse Program Note  Date of Encounter: 06/12/2015  Past Medical History: Past Medical History  Diagnosis Date  . Heart disease     Encounter Details:     CNP Questionnaire - 06/12/15 2008    Patient Demographics   Is this a new or existing patient? Existing   Patient is considered a/an Not Applicable   Race African-American/Black   Patient Assistance   Location of Patient Assistance Not Applicable   Patient's financial/insurance status Low Income;[employer]/Care Connects   Uninsured Patient Yes   Interventions Counseled to make appt. with provider   Patient referred to apply for the following financial assistance Not Applicable   Food insecurities addressed Provided food supplies   Transportation assistance No   Assistance securing medications No   Educational health offerings Chronic disease;Hypertension   Encounter Details   Primary purpose of visit Education/Health Concerns   Was an Emergency Department visit averted? Not Applicable   Does patient have a medical provider? Yes   Patient referred to Not Applicable   Was a mental health screening completed? (GAINS tool) No   Does patient have dental issues? No   Does patient have vision issues? No   Since previous encounter, have you referred patient for abnormal blood pressure that resulted in a new diagnosis or medication change? No   Since previous encounter, have you referred patient for abnormal blood glucose that resulted in a new diagnosis or medication change? No   For Abstraction Use Only   Does patient have insurance? No       B/P check

## 2017-04-27 NOTE — Congregational Nurse Program (Signed)
Congregational Nurse Program Note  Date of Encounter: 04/22/2017  Past Medical History: Past Medical History:  Diagnosis Date  . Heart disease     Encounter Details: CNP Questionnaire - 04/27/17 1209      Questionnaire   Patient Status  Not Applicable    Race  White or Caucasian    Location Patient Served At  Not Applicable    Insurance  Not Applicable    Uninsured  Uninsured (NEW 1x/quarter)    Food  Yes, have food insecurities;Within past 12 months, worried food would run out with no money to buy more;Within past 12 months, food ran out with no money to buy more    Housing/Utilities  No permanent housing    Transportation  Yes, need transportation assistance    Interpersonal Safety  No, do not feel physically and emotionally safe where you currently live    Medication  Yes, have medication insecurities    Medical Provider  Yes    Referrals  Area Agency    ED Visit Averted  Yes    Life-Saving Intervention Made  Not Applicable      Client reports he "desperate -- cannot continue living on the streets".  Tearful.  States saw a Agricultural consultantvolunteer at [facility] who encouraged him to go the emergency [facility].  Client opted to see me first.  PHQ-9 18/36.  Denies SI/HI.  [facility].  Encouraged him to set up [facility]

## 2017-04-27 NOTE — Congregational Nurse Program (Signed)
Congregational Nurse Program Note  Date of Encounter: 04/27/2017  Past Medical History: Past Medical History:  Diagnosis Date  . Heart disease     Encounter Details: CNP Questionnaire - 04/27/17 1220      Questionnaire   Patient Status  Not Applicable    Race  White or Caucasian    Location Patient Served At  Not Applicable    Insurance  Not Applicable    Uninsured  Uninsured (NEW 1x/quarter)    Food  Yes, have food insecurities;Within past 12 months, worried food would run out with no money to buy more;Within past 12 months, food ran out with no money to buy more    Housing/Utilities  No permanent housing    Transportation  Yes, need transportation assistance    Interpersonal Safety  No, do not feel physically and emotionally safe where you currently live    Medication  Yes, have medication insecurities    Medical Provider  Yes    Referrals  Area Agency    ED Visit Averted  Yes    Life-Saving Intervention Made  Not Applicable      Client reports he had completed the orange card renewal and asked me to review.  Homeless verification signed by me and reviewed with client the process for renewing his orange card.

## 2017-04-27 NOTE — Congregational Nurse Program (Signed)
Congregational Nurse Program Note  Date of Encounter: 04/24/2017  Past Medical History: Past Medical History:  Diagnosis Date  . Heart disease     Encounter Details: CNP Questionnaire - 04/27/17 1209      Questionnaire   Patient Status  Not Applicable    Race  White or Caucasian    Location Patient Served At  Not Applicable    Insurance  Not Applicable    Uninsured  Uninsured (NEW 1x/quarter)    Food  Yes, have food insecurities;Within past 12 months, worried food would run out with no money to buy more;Within past 12 months, food ran out with no money to buy more    Housing/Utilities  No permanent housing    Transportation  Yes, need transportation assistance    Interpersonal Safety  No, do not feel physically and emotionally safe where you currently live    Medication  Yes, have medication insecurities    Medical Provider  Yes    Referrals  Area Agency    ED Visit Averted  Yes    Life-Saving Intervention Made  Not Applicable      Client reports he has an appointment with [facility]. For counseling.  States is still waiting for his application approval for shelter.  Affect more bright.  States feels more hopeful.  Wants to renew orange card.  Assisted with application.

## 2018-07-05 ENCOUNTER — Encounter: Payer: Self-pay | Admitting: Pediatric Intensive Care

## 2018-08-06 NOTE — Congregational Nurse Program (Signed)
  Dept: [phone]   Congregational Nurse Program Note  Date of Encounter: 07/05/2018  Past Medical History: Past Medical History:  Diagnosis Date  . Heart disease     Encounter Details:new client encounter. Client states he is a patient at [facility] medical clinic. He states long history of crack cocaine use. States he was incarcerated and didn't use while in jail. States no SI/HI but no anchors to prevent substance use. He recognizes that he needs to "stabilize this situation". He will follow up in clinic to further address use but states that he doesn't want to stop using today. Shann Medal RN BSN CNP [phone]

## 2018-08-13 ENCOUNTER — Encounter: Payer: Self-pay | Admitting: *Deleted

## 2018-08-13 NOTE — Congregational Nurse Program (Signed)
COVID 19 Hotel Screeening: Patient denies any concerns at this time and states he is taking lisinopril. BP was within normal limits.

## 2018-08-20 NOTE — Progress Notes (Signed)
COVID Hotel Screening performed. Temperature, PHQ-9, and need for medical care and medications assessed. No additional needs assessed at this time.  Jeannett Dekoning RN MSN 

## 2018-08-22 NOTE — Progress Notes (Signed)
COVID Hotel Screening performed. Temperature, PHQ-9, and need for medical care and medications assessed. No needs assessed at this time.  Felesha Moncrieffe RN MSN 

## 2018-08-27 NOTE — Progress Notes (Signed)
COVID Hotel Screening performed. Temperature, PHQ-9, and need for medical care and medications assessed. No additional needs assessed at this time.  Austan Nicholl RN MSN 

## 2018-08-30 NOTE — Congregational Nurse Program (Signed)
Closing encounter per request. 

## 2018-08-30 NOTE — Progress Notes (Signed)
COVID Hotel Screening performed. Patient wanted to have his blood pressure taken. NO additional needs assessed at this time.  Carlyle Basques RN MSN

## 2018-09-03 NOTE — Progress Notes (Signed)
COVID Hotel Screening performed. Temperature, PHQ-9, and need for medical care and medications assessed. No additional needs assessed at this time.  Kyanna Mahrt RN MSN 

## 2018-09-12 NOTE — Progress Notes (Signed)
COVID Hotel Screening performed. Temperature, PHQ-9, and need for medical care and medications assessed. No additional needs assessed at this time.  Starling Christofferson RN MSN 

## 2018-09-19 NOTE — Progress Notes (Signed)
COVID Hotel Screening performed. Temperature, PHQ-9, and need for medical care and medications assessed. No additional needs assessed at this time.  Avanni Turnbaugh RN MSN 

## 2018-09-21 ENCOUNTER — Telehealth: Payer: Self-pay | Admitting: *Deleted

## 2018-09-21 DIAGNOSIS — Z20822 Contact with and (suspected) exposure to covid-19: Secondary | ICD-10-CM

## 2018-09-21 NOTE — Telephone Encounter (Signed)
Order placed for COVID-19 testing.  

## 2018-09-24 NOTE — Progress Notes (Signed)
COVID Hotel Screening performed. Temperature, PHQ-9, and need for medical care and medications assessed. Patient agreed to the COVID-19 testing. No additional needs assessed at this time.  Chia Rock RN MSN 

## 2018-09-29 LAB — NOVEL CORONAVIRUS, NAA: SARS-CoV-2, NAA: NOT DETECTED

## 2018-09-30 ENCOUNTER — Encounter: Payer: Self-pay | Admitting: *Deleted

## 2018-09-30 ENCOUNTER — Telehealth: Payer: Self-pay

## 2018-09-30 NOTE — Progress Notes (Signed)
COVID Hotel Screening performed. Temperature, PHQ-9, and need for medical care and medications assessed. No additional needs assessed at this time.  Helyne Genther RN MSN 

## 2018-09-30 NOTE — Telephone Encounter (Signed)
Attempted to give patient COVID results, however number is incorrect.

## 2018-10-08 NOTE — Progress Notes (Signed)
COVID Hotel Screening performed. COVID screening, temperature, PHQ-9, and need for medical care and medications assessed. No additional needs assessed at this time.  Timothy Townsel RN MSN 

## 2018-10-17 NOTE — Progress Notes (Signed)
COVID Hotel Screening performed. COVID screening, temperature, PHQ-9, and need for medical care and medications assessed. No additional needs assessed at this time.  Nolie Bignell RN MSN 

## 2018-10-24 NOTE — Progress Notes (Signed)
COVID Hotel Screening performed. Temperature, PHQ-9, and need for medical care and medications assessed. No additional needs assessed at this time.  Kimball Appleby  MSN, RN 

## 2018-11-07 NOTE — Progress Notes (Signed)
COVID Hotel Screening performed. Temperature, PHQ-9, and medication assessment.   Kendrick Haapala MSN, RN 

## 2018-12-21 NOTE — Progress Notes (Signed)
COVID-19 Screening performed. Temperature, PHQ-9, and need for medical care and medications assessed. No additional needs assessed at this time.  [name] MSN, RN 

## 2019-06-10 ENCOUNTER — Other Ambulatory Visit: Payer: Self-pay

## 2019-06-10 DIAGNOSIS — Z20822 Contact with and (suspected) exposure to covid-19: Secondary | ICD-10-CM

## 2019-06-11 LAB — NOVEL CORONAVIRUS, NAA: SARS-CoV-2, NAA: NOT DETECTED

## 2019-12-29 ENCOUNTER — Emergency Department (HOSPITAL_COMMUNITY)
Admission: EM | Admit: 2019-12-29 | Discharge: 2019-12-29 | Disposition: A | Discharge status: Home / Self Care | Payer: Self-pay | Attending: Emergency Medicine | Admitting: Emergency Medicine

## 2019-12-29 ENCOUNTER — Emergency Department (HOSPITAL_COMMUNITY): Payer: Self-pay

## 2019-12-29 ENCOUNTER — Encounter (HOSPITAL_COMMUNITY): Payer: Self-pay

## 2019-12-29 DIAGNOSIS — Y929 Unspecified place or not applicable: Secondary | ICD-10-CM | POA: Insufficient documentation

## 2019-12-29 DIAGNOSIS — Y939 Activity, unspecified: Secondary | ICD-10-CM | POA: Insufficient documentation

## 2019-12-29 DIAGNOSIS — Z7982 Long term (current) use of aspirin: Secondary | ICD-10-CM | POA: Insufficient documentation

## 2019-12-29 DIAGNOSIS — S0091XA Abrasion of unspecified part of head, initial encounter: Secondary | ICD-10-CM | POA: Insufficient documentation

## 2019-12-29 DIAGNOSIS — S0081XA Abrasion of other part of head, initial encounter: Secondary | ICD-10-CM | POA: Insufficient documentation

## 2019-12-29 DIAGNOSIS — J3489 Other specified disorders of nose and nasal sinuses: Secondary | ICD-10-CM | POA: Insufficient documentation

## 2019-12-29 DIAGNOSIS — S0083XA Contusion of other part of head, initial encounter: Secondary | ICD-10-CM | POA: Insufficient documentation

## 2019-12-29 DIAGNOSIS — Z79899 Other long term (current) drug therapy: Secondary | ICD-10-CM | POA: Insufficient documentation

## 2019-12-29 DIAGNOSIS — M549 Dorsalgia, unspecified: Secondary | ICD-10-CM | POA: Insufficient documentation

## 2019-12-29 DIAGNOSIS — Y999 Unspecified external cause status: Secondary | ICD-10-CM | POA: Insufficient documentation

## 2019-12-29 DIAGNOSIS — F17200 Nicotine dependence, unspecified, uncomplicated: Secondary | ICD-10-CM | POA: Insufficient documentation

## 2019-12-29 DIAGNOSIS — Z955 Presence of coronary angioplasty implant and graft: Secondary | ICD-10-CM | POA: Insufficient documentation

## 2019-12-29 DIAGNOSIS — S0990XA Unspecified injury of head, initial encounter: Secondary | ICD-10-CM | POA: Insufficient documentation

## 2019-12-29 DIAGNOSIS — Z23 Encounter for immunization: Secondary | ICD-10-CM | POA: Insufficient documentation

## 2019-12-29 MED ORDER — METHOCARBAMOL 750 MG PO TABS: 750.0000 mg | ORAL_TABLET | Freq: Two times a day (BID) | ORAL | 0 refills | Status: DC | PRN

## 2019-12-29 MED ORDER — METHOCARBAMOL 500 MG PO TABS
500.0000 mg | ORAL_TABLET | Freq: Once | ORAL | Status: AC
  Administered 2019-12-29: 500 mg via ORAL
  Filled 2019-12-29: qty 1

## 2019-12-29 MED ORDER — LIDOCAINE 5 % EX PTCH: 1.0000 | MEDICATED_PATCH | CUTANEOUS | 0 refills | Status: DC

## 2019-12-29 MED ORDER — TETANUS-DIPHTH-ACELL PERTUSSIS 5-2.5-18.5 LF-MCG/0.5 IM SUSP
0.5000 mL | Freq: Once | INTRAMUSCULAR | Status: AC
  Administered 2019-12-29: 0.5 mL via INTRAMUSCULAR
  Filled 2019-12-29: qty 0.5

## 2019-12-29 MED ORDER — ACETAMINOPHEN 500 MG PO TABS
1000.0000 mg | ORAL_TABLET | Freq: Once | ORAL | Status: AC
  Administered 2019-12-29: 1000 mg via ORAL
  Filled 2019-12-29: qty 2
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
## 2019-12-29 NOTE — Discharge Instructions (Signed)
There was a mass noted in one of the sinuses.  This needs to be further evaluated by the ear nose and throat specialist.  Please follow-up with them as soon as possible.  Call to make an appointment.    Head Injury You have been seen today for a head injury, which may or may not have resulted in a concussion. It does not appear to be serious at this time, however, it is important to note that your presentation today is not necessarily an indication of the severity of future symptoms.  Expected symptoms: Expected symptoms of concussion and/or head injury can include nausea, headache, mild dizziness (should still be able to get up and walk around without difficulty), difficulty concentrating, increased sleep, difficulty sleeping, increased intensity of emotions. Close observation: The close observation period is usually 6 hours from the injury. This includes staying awake and having a trustworthy adult monitor you to assure your condition does not worsen. You should be in regular contact with this person and ideally, they should be able to monitor you in person.  Secondary observation: The secondary observation period is usually 24 hours from the injury. You are allowed to sleep during this time. A trustworthy adult should intermittently monitor you to assure your condition does not worsen.   Overall head injury/concussion care: Rest: Be sure to get plenty of rest. You will need more rest and sleep while you recover. Hydration: Be sure to stay well hydrated by having a goal of drinking about 0.5 liters of water an hour. Pain:  Antiinflammatory medications: Take 600 mg of ibuprofen every 6 hours or 440 mg (over the counter dose) to 500 mg (prescription dose) of naproxen every 12 hours or for the next 3 days. After this time, these medications may be used as needed for pain. Take these medications with food to avoid upset stomach. Choose only one of these medications, do not take them together. Tylenol:  Should you continue to have additional pain while taking the ibuprofen or naproxen, you may add in tylenol as needed. Your daily total maximum amount of tylenol from all sources should be limited to 4000mg /day for persons without liver problems, or 2000mg /day for those with liver problems. Return to sports and activities: In general, you may return to normal activities once symptoms have subsided, however, you would ideally be cleared by a primary care provider or other qualified medical professional prior to return to these activities.  Follow up: Follow up with the concussion clinic or your primary care provider for further management of this issue. Return: Return to the ED should you begin to have confusion, abnormal behavior, aggression, violence, or personality changes, repeated vomiting, vision loss, numbness or weakness on one side of the body, difficulty standing due to dizziness, significantly worsening pain, or any other major concerns.   Expect your soreness to increase over the next 2-3 days. Take it easy, but do not lay around too much as this may make any stiffness worse.  Antiinflammatory medications: Take 600 mg of ibuprofen every 6 hours or 440 mg (over the counter dose) to 500 mg (prescription dose) of naproxen every 12 hours for the next 3 days. After this time, these medications may be used as needed for pain. Take these medications with food to avoid upset stomach. Choose only one of these medications, do not take them together. Acetaminophen (generic for Tylenol): Should you continue to have additional pain while taking the ibuprofen or naproxen, you may add in acetaminophen as needed.  Your daily total maximum amount of acetaminophen from all sources should be limited to 4000mg /day for persons without liver problems, or 2000mg /day for those with liver problems. Methocarbamol: Methocarbamol (generic for Robaxin) is a muscle relaxer and can help relieve stiff muscles or muscle spasms.   Do not drive or perform other dangerous activities while taking this medication as it can cause drowsiness as well as changes in reaction time and judgement. Lidocaine patches: These are available via either prescription or over-the-counter. The over-the-counter option may be more economical one and are likely just as effective. There are multiple over-the-counter brands, such as Salonpas. Ice: May apply ice to the area over the next 24 hours for 15 minutes at a time to reduce pain, inflammation, and swelling, if present. Exercises: Be sure to perform the attached exercises starting with three times a week and working up to performing them daily. This is an essential part of preventing long term problems.  Follow up: Follow up with a primary care provider for any future management of these complaints. Be sure to follow up within 7-10 days. Return: Return to the ED should symptoms worsen.  For prescription assistance, may try using prescription discount sites or apps, such as goodrx.com   Wound Care - Dermabond  Your wound has been closed with a medical-grade glue called Dermabond. Please adhere to the following wound care instructions:  You may shower, but avoid submerging the wound. Do not scrub the wound, as this may cause the glue to wear off prematurely.    The glue will wear off on its own, usually within 5-10 days. During this time period DO NOT apply antibiotic ointments or any other ointments or lotions to the area as this can cause the glue to wear off prematurely.  After 10 days, you may apply ointments, such as Neosporin, to the area to help the remaining glue to wear off.   After the wound has healed and the glue is gone, you may apply ointments such as Aquaphor to the wound to reduce scarring.  May use ibuprofen, naproxen, or Tylenol for pain.  Return to the ED should the wound edges come apart or signs of infection arise, such as spreading redness, puffiness/swelling, pus  draining from the wound, severe increase in pain, or any other major issues.  For prescription assistance, may try using prescription discount sites or apps, such as goodrx.com

## 2019-12-29 NOTE — ED Notes (Signed)
Dermabond placed at bedside. 

## 2019-12-29 NOTE — ED Notes (Signed)
Patient verbalizes understanding of discharge instructions. Opportunity for questioning and answers were provided. Pt discharged from ED. 
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
## 2020-09-19 ENCOUNTER — Encounter: Payer: Self-pay | Admitting: *Deleted

## 2020-09-19 ENCOUNTER — Telehealth: Payer: Self-pay | Admitting: *Deleted

## 2020-09-19 NOTE — Telephone Encounter (Signed)
List provided for additional resources

## 2020-09-19 NOTE — Telephone Encounter (Signed)
Do not provide treatment for cocaine abuse

## 2020-09-19 NOTE — Telephone Encounter (Signed)
No beds available at this time.

## 2020-09-19 NOTE — Congregational Nurse Program (Signed)
  Dept: [phone]   Congregational Nurse Program Note  Date of Encounter: 09/19/2020  Past Medical History: Past Medical History:  Diagnosis Date  . Heart disease     Encounter Details:  CNP Questionnaire - 09/19/20 1353      Questionnaire   Do you give verbal consent to treat you today? Yes    Visit [facility] [employer] or Organization    Location Patient Served At [facility]    Patient Status Homeless    Medical Provider Yes    Insurance Uninsured (Includes [employer] [place])    Intervention Refer;Counsel;Support    Housing/Utilities No permanent housing    Referrals PCP - other provider;Other   [name], [name]         Client came into [facility] office seeking help with cocaine addiction. Client talked for an extended time about his stressors including no housing, friends, or family contact. Client reports he is ready to make a change. Contacted [facility] and they do not provide treatment for cocaine abuse. Contacted [facility] and they do not have an opening. Client reports he has a court appearance to make at the end of the month and expresses OP treatment would be better at this time. Referred to [facility] and [facility] for therapy. Gave paper work to complete. Client denies si and hi. Makael Stein W RN CN [phone]

## 2020-09-21 ENCOUNTER — Encounter: Payer: Self-pay | Admitting: *Deleted

## 2020-09-21 NOTE — Congregational Nurse Program (Signed)
  Dept: [phone]   Congregational Nurse Program Note  Date of Encounter: 09/21/2020  Past Medical History: Past Medical History:  Diagnosis Date  . Heart disease     Encounter Details:  CNP Questionnaire - 09/21/20 1220      Questionnaire   Do you give verbal consent to treat you today? Yes    Visit Setting [employer] or Organization    Location Patient Served At [facility]    Patient Status Homeless    Medical Provider Yes    Insurance Uninsured (Includes [employer] [place])    Intervention Support    Housing/Utilities No permanent housing          Saw client for follow up from previous visit. Client did go to [facility] as referred and has an appt in June. He brought in a paper containing several groups and says he plans to attend them around his schedule. Provided support and encouragement.

## 2020-11-05 ENCOUNTER — Encounter: Payer: Self-pay | Admitting: *Deleted

## 2020-11-05 NOTE — Congregational Nurse Program (Signed)
  Dept: [phone]   Congregational Nurse Program Note  Date of Encounter: 11/05/2020  Past Medical History: Past Medical History:  Diagnosis Date   Heart disease     Encounter Details:  CNP Questionnaire - 11/05/20 1210       Questionnaire   Do you give verbal consent to treat you today? 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 Client reports he recently got out of jail and plans to f/u with [facility] since he canceled his last appt. Client encouraged to f/u with [name] for his medications and [facility]. Client is relieved to have his sentence completed. He denies si and hi. Eleisha Branscomb W RN CN [phone]
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
## 2022-08-21 ENCOUNTER — Ambulatory Visit (HOSPITAL_COMMUNITY)
Admission: EM | Admit: 2022-08-21 | Discharge: 2022-08-21 | Disposition: A | Discharge status: Home / Self Care | Payer: No Payment, Other | Attending: Family | Admitting: Family

## 2022-08-21 ENCOUNTER — Telehealth (HOSPITAL_COMMUNITY): Payer: Self-pay | Admitting: Licensed Clinical Social Worker

## 2022-08-21 ENCOUNTER — Encounter (HOSPITAL_COMMUNITY): Payer: Self-pay | Admitting: Licensed Clinical Social Worker

## 2022-08-21 DIAGNOSIS — Z5900 Homelessness unspecified: Secondary | ICD-10-CM | POA: Insufficient documentation

## 2022-08-21 DIAGNOSIS — F1410 Cocaine abuse, uncomplicated: Secondary | ICD-10-CM | POA: Insufficient documentation

## 2022-08-21 NOTE — ED Notes (Signed)
Patient discharged by provider Tina Allen, NP with written and verbal instructions. Resources provided. 

## 2022-08-21 NOTE — Telephone Encounter (Signed)
Therapist receives a message from Ms. Doran Heater, FNP saying that this patient is interested in [facility] asking that this therapist email him as the patient has no phone. Thus, the therapist sends an email to [email or web address] with the subject line, "Contact information," and the following:  "Good afternoon,  I have been asked to reach out to you by Ms. Doran Heater, FNP, regarding our services. If you would like to speak with me about this, then please contact me directly at [phone].   Thank you,"   Myrna Blazer, MA, LCSW, [facility], LCAS 08/21/2022
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
## 2022-08-21 NOTE — Discharge Instructions (Addendum)
Patient is instructed prior to discharge to:  Take all medications as prescribed by his/her mental healthcare provider. Report any adverse effects and or reactions from the medicines to his/her outpatient provider promptly. Keep all scheduled appointments, to ensure that you are getting refills on time and to avoid any interruption in your medication.  If you are unable to keep an appointment call to reschedule.  Be sure to follow-up with resources and follow-up appointments provided.  Patient has been instructed & cautioned: To not engage in alcohol and or illegal drug use while on prescription medicines. In the event of worsening symptoms, patient is instructed to call the crisis hotline, 911 and or go to the nearest ED for appropriate evaluation and treatment of symptoms. To follow-up with his/her primary care provider for your other medical issues, concerns and or health care needs.  Information: -National Suicide Prevention Lifeline 1-800-SUICIDE or 409-704-6001.  -988 offers 24/7 access to trained crisis counselors who can help people experiencing mental health-related distress. People can call or text 988 or chat 988lifeline.org for themselves or if they are worried about a loved one who may need crisis support.      Substance Abuse Treatment Resources listed Below:  [facility] - Admissions are currently completed Monday through Friday at 8am; both appointments and walk-ins are accepted.  Any individual that is a [facility] resident may present for a substance abuse screening and assessment for admission.  A person may be referred by numerous sources or self-refer.   Potential clients will be screened for medical necessity and appropriateness for the program.  Clients must meet criteria for high-intensity residential treatment services.  If clinically appropriate, a client will continue with the comprehensive clinical assessment and intake process, as well  as enrollment in the [facility].  Address: [address] [place], [place] [address] Admin Hours: Bettye Boeck to [facility] Hours: 24/7 Phone: [phone] Fax: [phone]  [facility] - [facility] Address: [address] [place], [place], [place] [address] Behavioral Health Urgent Care [facility]) Hours: 24/7 Phone: [phone] Fax: [phone]  [facility] ([facility]): (offers outpatient therapy and intensive outpatient substance abuse therapy).  [address], [place], [place] [address] Phone: [phone]    [facility] [address] [place], [place] [address] Phone: [phone] ext [phone] The [facility] provides food, shelter and other programs and services to the homeless men of [place] through our [employer] program.  By offering safe shelter, three meals a day, clean clothing, Biblical counseling, financial planning, vocational training, GED/education and employment assistance, we've helped mend the shattered lives of many homeless men since opening in 1974.  We have approximately 267 beds available, with a max of 312 beds including mats for emergency situations and currently house an average of 270 men a night.  Prospective Client Check-In Information Photo ID Required (State/ Out of State/ [facility]) - if photo ID is not available, clients are required to have a printout of a police/sheriff's criminal history report. Help out with chores around the [facility]. No sex offender of any type (pending, charged, registered and/or any other sex related offenses) will be permitted to check in. Must be willing to abide by all rules, regulations, and policies established by the [employer]. The following will be provided - shelter, food, clothing, and biblical counseling. If you or someone you know is in need of assistance at our [facility] shelter in [place], [place], please call [phone] ext. [phone].  [facility]-will provide timely access to mental health services for children and adolescents (4-17) and adults presenting in a mental health crisis. The program is designed for those who need urgent Behavioral Health or Substance Use treatment and are not experiencing a medical crisis that would typically require an emergency room visit.    [address] [place], [place] [address] Phone: [phone] [email or web address]  [facility]: Phone#: [phone]  The [facility] SA Intensive Outpatient Program (SAIOP) means structured individual and group addiction activities and services that are provided at an outpatient program designed to assist adult and adolescent consumers to begin recovery and learn skills for recovery maintenance. The [facility] SAIOP program is offered at least 3 hours a day, 3 days a week.SAIOP services shall include a structured program consisting of, but not limited to, the following services: Individual counseling and support; Group counseling and support; Family counseling, training or support; Biochemical assays to identify recent drug use (e.g., urine drug screens); Strategies for relapse prevention to include community and social support systems in treatment; Life skills; Crisis contingency planning; Disease Management; and Treatment support activities that have been adapted or specifically designed for persons with physical disabilities, or persons with co-occurring disorders of mental illness and substance abuse/dependence or mental retardation/developmental disability and substance abuse/dependence. Phone: [phone]  Address:   The [facility] will also offer the following outpatient services: (Monday through Friday 8am-5pm)   Partial Hospitalization Program (PHP) Substance Abuse Intensive Outpatient Program (SA-IOP) Group Therapy Medication Management Peer Living Room We also provide (24/7):   Assessments: Our mental health clinician and providers will conduct a focused mental health evaluation, assessing for immediate safety concerns and further mental health needs. Referral: Our team will provide resources and help connect to community based mental health treatment, when indicated, including psychotherapy, psychiatry, and other specialized behavioral health or substance use disorder services (for those not already in treatment). Transitional Care: Our team providers in person bridging and/or telephonic follow-up during the patient's transition to outpatient services.  The [facility] 24-Hour Call Center: [phone] Behavioral Health Crisis Line: [phone]

## 2022-08-21 NOTE — Progress Notes (Signed)
   08/21/22 1419  [facility] Triage Screening (Walk-ins at [facility] only)  How Did You Hear About [place]? Self  What Is the Reason for Your Visit/Call Today? Pt is a 62 yo male who presents to [facility] voluntarily seeking outpatient services. Pt reports that he hx of cocaine use but has not used in 2 weeks and wishes to continue to "do the right thing." Pt reports that he does not have a MH diagnosis. Pt reports that he has hx seeking treatment at [facility] and [facility] but has not had any outpatient services in 2 years. Pt reports that he is homeless. Pt denies SI, HI, and AVH.  How Long Has This Been Causing You Problems? > than 6 months  Have You Recently Had Any Thoughts About Hurting Yourself? No  Are You Planning to Commit Suicide/Harm Yourself At This time? No  Have you Recently Had Thoughts About Hurting Someone Karolee Ohs? No  Are you currently experiencing any auditory, visual or other hallucinations? No  Have You Used Any Alcohol or Drugs in the Past 24 Hours? No  Do you have any current medical co-morbidities that require immediate attention? No  Clinician description of patient physical appearance/behavior: Pt was casually dressed and adequately groomed. Pt was polite and cooperative. Pt's speech, movement and thought content were within normal limits. Pt's mood was appropriote Pt reports feeling hopeful. Pt was oriented x 4.  What Do You Feel Would Help You the Most Today? Alcohol or Drug Use Treatment;Stress Management;Housing Assistance;Financial Resources  If access to [facility] was not available, would you have sought care in the Emergency Department? Yes  Determination of Need Routine (7 days)  Options For Referral Outpatient Therapy    Flowsheet Row ED from 08/21/2022 in [facility]  C-SSRS RISK CATEGORY No Risk

## 2022-11-27 NOTE — Progress Notes (Signed)
Pt given information on PCPs

## 2024-02-19 ENCOUNTER — Emergency Department (HOSPITAL_COMMUNITY): Payer: MEDICAID

## 2024-02-19 ENCOUNTER — Inpatient Hospital Stay (HOSPITAL_COMMUNITY): Payer: MEDICAID

## 2024-02-19 ENCOUNTER — Encounter (HOSPITAL_COMMUNITY): Admission: EM | Disposition: E | Payer: Self-pay | Source: Home / Self Care | Attending: Critical Care Medicine

## 2024-02-19 DIAGNOSIS — I5020 Unspecified systolic (congestive) heart failure: Secondary | ICD-10-CM

## 2024-02-19 DIAGNOSIS — Z5901 Sheltered homelessness: Secondary | ICD-10-CM | POA: Diagnosis not present

## 2024-02-19 DIAGNOSIS — N170 Acute kidney failure with tubular necrosis: Secondary | ICD-10-CM | POA: Diagnosis present

## 2024-02-19 DIAGNOSIS — S2231XA Fracture of one rib, right side, initial encounter for closed fracture: Secondary | ICD-10-CM | POA: Diagnosis present

## 2024-02-19 DIAGNOSIS — S0990XA Unspecified injury of head, initial encounter: Secondary | ICD-10-CM

## 2024-02-19 DIAGNOSIS — I213 ST elevation (STEMI) myocardial infarction of unspecified site: Secondary | ICD-10-CM

## 2024-02-19 DIAGNOSIS — D696 Thrombocytopenia, unspecified: Secondary | ICD-10-CM | POA: Diagnosis present

## 2024-02-19 DIAGNOSIS — G931 Anoxic brain damage, not elsewhere classified: Secondary | ICD-10-CM | POA: Diagnosis present

## 2024-02-19 DIAGNOSIS — R570 Cardiogenic shock: Secondary | ICD-10-CM | POA: Diagnosis present

## 2024-02-19 DIAGNOSIS — I447 Left bundle-branch block, unspecified: Secondary | ICD-10-CM | POA: Diagnosis present

## 2024-02-19 DIAGNOSIS — E440 Moderate protein-calorie malnutrition: Secondary | ICD-10-CM | POA: Diagnosis present

## 2024-02-19 DIAGNOSIS — G9608 Other cranial cerebrospinal fluid leak: Secondary | ICD-10-CM | POA: Diagnosis present

## 2024-02-19 DIAGNOSIS — Z7982 Long term (current) use of aspirin: Secondary | ICD-10-CM

## 2024-02-19 DIAGNOSIS — Z789 Other specified health status: Secondary | ICD-10-CM | POA: Diagnosis not present

## 2024-02-19 DIAGNOSIS — S065XAA Traumatic subdural hemorrhage with loss of consciousness status unknown, initial encounter: Secondary | ICD-10-CM | POA: Diagnosis present

## 2024-02-19 DIAGNOSIS — R569 Unspecified convulsions: Secondary | ICD-10-CM | POA: Diagnosis present

## 2024-02-19 DIAGNOSIS — G9340 Encephalopathy, unspecified: Secondary | ICD-10-CM | POA: Diagnosis not present

## 2024-02-19 DIAGNOSIS — I2119 ST elevation (STEMI) myocardial infarction involving other coronary artery of inferior wall: Secondary | ICD-10-CM | POA: Diagnosis present

## 2024-02-19 DIAGNOSIS — E876 Hypokalemia: Secondary | ICD-10-CM | POA: Diagnosis present

## 2024-02-19 DIAGNOSIS — G253 Myoclonus: Secondary | ICD-10-CM | POA: Diagnosis present

## 2024-02-19 DIAGNOSIS — Z955 Presence of coronary angioplasty implant and graft: Secondary | ICD-10-CM

## 2024-02-19 DIAGNOSIS — Y92481 Parking lot as the place of occurrence of the external cause: Secondary | ICD-10-CM | POA: Diagnosis not present

## 2024-02-19 DIAGNOSIS — Z951 Presence of aortocoronary bypass graft: Secondary | ICD-10-CM

## 2024-02-19 DIAGNOSIS — Z7902 Long term (current) use of antithrombotics/antiplatelets: Secondary | ICD-10-CM

## 2024-02-19 DIAGNOSIS — Z515 Encounter for palliative care: Secondary | ICD-10-CM

## 2024-02-19 DIAGNOSIS — E8729 Other acidosis: Secondary | ICD-10-CM

## 2024-02-19 DIAGNOSIS — S0181XA Laceration without foreign body of other part of head, initial encounter: Secondary | ICD-10-CM | POA: Diagnosis present

## 2024-02-19 DIAGNOSIS — W1830XA Fall on same level, unspecified, initial encounter: Secondary | ICD-10-CM | POA: Diagnosis present

## 2024-02-19 DIAGNOSIS — E872 Acidosis, unspecified: Secondary | ICD-10-CM | POA: Diagnosis present

## 2024-02-19 DIAGNOSIS — J341 Cyst and mucocele of nose and nasal sinus: Secondary | ICD-10-CM | POA: Diagnosis not present

## 2024-02-19 DIAGNOSIS — I469 Cardiac arrest, cause unspecified: Principal | ICD-10-CM

## 2024-02-19 DIAGNOSIS — I5023 Acute on chronic systolic (congestive) heart failure: Secondary | ICD-10-CM | POA: Diagnosis present

## 2024-02-19 DIAGNOSIS — I462 Cardiac arrest due to underlying cardiac condition: Secondary | ICD-10-CM | POA: Diagnosis present

## 2024-02-19 DIAGNOSIS — I4901 Ventricular fibrillation: Principal | ICD-10-CM | POA: Diagnosis present

## 2024-02-19 DIAGNOSIS — J9601 Acute respiratory failure with hypoxia: Secondary | ICD-10-CM

## 2024-02-19 DIAGNOSIS — I110 Hypertensive heart disease with heart failure: Secondary | ICD-10-CM | POA: Diagnosis present

## 2024-02-19 DIAGNOSIS — N179 Acute kidney failure, unspecified: Secondary | ICD-10-CM

## 2024-02-19 DIAGNOSIS — D751 Secondary polycythemia: Secondary | ICD-10-CM | POA: Diagnosis present

## 2024-02-19 DIAGNOSIS — I255 Ischemic cardiomyopathy: Secondary | ICD-10-CM | POA: Diagnosis not present

## 2024-02-19 DIAGNOSIS — J690 Pneumonitis due to inhalation of food and vomit: Secondary | ICD-10-CM | POA: Diagnosis present

## 2024-02-19 DIAGNOSIS — Z6823 Body mass index (BMI) 23.0-23.9, adult: Secondary | ICD-10-CM

## 2024-02-19 DIAGNOSIS — F17210 Nicotine dependence, cigarettes, uncomplicated: Secondary | ICD-10-CM | POA: Diagnosis present

## 2024-02-19 DIAGNOSIS — I2510 Atherosclerotic heart disease of native coronary artery without angina pectoris: Secondary | ICD-10-CM | POA: Diagnosis present

## 2024-02-19 DIAGNOSIS — S0181XD Laceration without foreign body of other part of head, subsequent encounter: Secondary | ICD-10-CM | POA: Diagnosis not present

## 2024-02-19 DIAGNOSIS — I480 Paroxysmal atrial fibrillation: Secondary | ICD-10-CM | POA: Diagnosis present

## 2024-02-19 DIAGNOSIS — Z9911 Dependence on respirator [ventilator] status: Secondary | ICD-10-CM

## 2024-02-19 DIAGNOSIS — R64 Cachexia: Secondary | ICD-10-CM | POA: Diagnosis present

## 2024-02-19 DIAGNOSIS — Z711 Person with feared health complaint in whom no diagnosis is made: Secondary | ICD-10-CM | POA: Diagnosis not present

## 2024-02-19 DIAGNOSIS — F1410 Cocaine abuse, uncomplicated: Secondary | ICD-10-CM | POA: Diagnosis present

## 2024-02-19 DIAGNOSIS — Z79899 Other long term (current) drug therapy: Secondary | ICD-10-CM

## 2024-02-19 DIAGNOSIS — Z66 Do not resuscitate: Secondary | ICD-10-CM | POA: Diagnosis not present

## 2024-02-19 DIAGNOSIS — I5021 Acute systolic (congestive) heart failure: Secondary | ICD-10-CM | POA: Diagnosis present

## 2024-02-19 DIAGNOSIS — E785 Hyperlipidemia, unspecified: Secondary | ICD-10-CM | POA: Diagnosis present

## 2024-02-19 DIAGNOSIS — G4089 Other seizures: Secondary | ICD-10-CM | POA: Diagnosis not present

## 2024-02-19 DIAGNOSIS — Z7189 Other specified counseling: Secondary | ICD-10-CM | POA: Diagnosis not present

## 2024-02-19 DIAGNOSIS — R402432 Glasgow coma scale score 3-8, at arrival to emergency department: Secondary | ICD-10-CM | POA: Diagnosis present

## 2024-02-19 DIAGNOSIS — E8721 Acute metabolic acidosis: Secondary | ICD-10-CM | POA: Diagnosis not present

## 2024-02-19 HISTORY — DX: Atherosclerotic heart disease of native coronary artery without angina pectoris: I25.10

## 2024-02-19 HISTORY — DX: Presence of aortocoronary bypass graft: Z95.1

## 2024-02-19 HISTORY — DX: Cocaine abuse, uncomplicated: F14.10

## 2024-02-19 LAB — I-STAT CG4 LACTIC ACID, ED: Lactic Acid, Venous: 9.3 mmol/L (ref 0.5–1.9)

## 2024-02-19 LAB — I-STAT CHEM 8, ED
BUN: 22 mg/dL (ref 8–23)
Calcium, Ion: 1.02 mmol/L — ABNORMAL LOW (ref 1.15–1.40)
Chloride: 104 mmol/L (ref 98–111)
Creatinine, Ser: 1.3 mg/dL — ABNORMAL HIGH (ref 0.61–1.24)
Glucose, Bld: 303 mg/dL — ABNORMAL HIGH (ref 70–99)
HCT: 45 % (ref 39.0–52.0)
Hemoglobin: 15.3 g/dL (ref 13.0–17.0)
Potassium: 3.7 mmol/L (ref 3.5–5.1)
Sodium: 137 mmol/L (ref 135–145)
TCO2: 17 mmol/L — ABNORMAL LOW (ref 22–32)

## 2024-02-19 LAB — CBC
HCT: 47.7 % (ref 39.0–52.0)
HCT: 54.7 % — ABNORMAL HIGH (ref 39.0–52.0)
Hemoglobin: 15 g/dL (ref 13.0–17.0)
Hemoglobin: 17.8 g/dL — ABNORMAL HIGH (ref 13.0–17.0)
MCH: 30 pg (ref 26.0–34.0)
MCH: 30.7 pg (ref 26.0–34.0)
MCHC: 31.4 g/dL (ref 30.0–36.0)
MCHC: 32.5 g/dL (ref 30.0–36.0)
MCV: 92.1 fL (ref 80.0–100.0)
MCV: 97.5 fL (ref 80.0–100.0)
Platelets: 233 K/uL (ref 150–400)
Platelets: 264 K/uL (ref 150–400)
RBC: 4.89 MIL/uL (ref 4.22–5.81)
RBC: 5.94 MIL/uL — ABNORMAL HIGH (ref 4.22–5.81)
RDW: 12.8 % (ref 11.5–15.5)
RDW: 12.9 % (ref 11.5–15.5)
WBC: 13.8 K/uL — ABNORMAL HIGH (ref 4.0–10.5)
WBC: 22.8 K/uL — ABNORMAL HIGH (ref 4.0–10.5)
nRBC: 0 % (ref 0.0–0.2)
nRBC: 0.4 % — ABNORMAL HIGH (ref 0.0–0.2)

## 2024-02-19 LAB — I-STAT ARTERIAL BLOOD GAS, ED
Acid-base deficit: 11 mmol/L — ABNORMAL HIGH (ref 0.0–2.0)
Bicarbonate: 15.7 mmol/L — ABNORMAL LOW (ref 20.0–28.0)
Calcium, Ion: 1.11 mmol/L — ABNORMAL LOW (ref 1.15–1.40)
HCT: 54 % — ABNORMAL HIGH (ref 39.0–52.0)
Hemoglobin: 18.4 g/dL — ABNORMAL HIGH (ref 13.0–17.0)
O2 Saturation: 99 %
Patient temperature: 97.3
Potassium: 3.8 mmol/L (ref 3.5–5.1)
Sodium: 134 mmol/L — ABNORMAL LOW (ref 135–145)
TCO2: 17 mmol/L — ABNORMAL LOW (ref 22–32)
pCO2 arterial: 35.4 mmHg (ref 32–48)
pH, Arterial: 7.252 — ABNORMAL LOW (ref 7.35–7.45)
pO2, Arterial: 155 mmHg — ABNORMAL HIGH (ref 83–108)

## 2024-02-19 LAB — TROPONIN I (HIGH SENSITIVITY)
Troponin I (High Sensitivity): 173 ng/L (ref <18)
Troponin I (High Sensitivity): 5970 ng/L (ref <18)
Troponin I (High Sensitivity): 15390 ng/L (ref <18)

## 2024-02-19 LAB — TYPE AND SCREEN
ABO/RH(D): O NEG
Antibody Screen: NEGATIVE

## 2024-02-19 LAB — COMPREHENSIVE METABOLIC PANEL WITH GFR
ALT: 39 U/L (ref 0–44)
AST: 80 U/L — ABNORMAL HIGH (ref 15–41)
Albumin: 3.1 g/dL — ABNORMAL LOW (ref 3.5–5.0)
Alkaline Phosphatase: 58 U/L (ref 38–126)
Anion gap: 19 — ABNORMAL HIGH (ref 5–15)
BUN: 16 mg/dL (ref 8–23)
CO2: 14 mmol/L — ABNORMAL LOW (ref 22–32)
Calcium: 7.9 mg/dL — ABNORMAL LOW (ref 8.9–10.3)
Chloride: 102 mmol/L (ref 98–111)
Creatinine, Ser: 1.42 mg/dL — ABNORMAL HIGH (ref 0.61–1.24)
GFR, Estimated: 56 mL/min — ABNORMAL LOW (ref >60)
Glucose, Bld: 304 mg/dL — ABNORMAL HIGH (ref 70–99)
Potassium: 3.8 mmol/L (ref 3.5–5.1)
Sodium: 135 mmol/L (ref 135–145)
Total Bilirubin: 0.9 mg/dL (ref 0.0–1.2)
Total Protein: 5.6 g/dL — ABNORMAL LOW (ref 6.5–8.1)

## 2024-02-19 LAB — HEMOGLOBIN A1C
Hgb A1c MFr Bld: 5.1 % (ref 4.8–5.6)
Mean Plasma Glucose: 99.67 mg/dL

## 2024-02-19 LAB — CBG MONITORING, ED: Glucose-Capillary: 155 mg/dL — ABNORMAL HIGH (ref 70–99)

## 2024-02-19 LAB — PROTIME-INR
INR: 1.5 — ABNORMAL HIGH (ref 0.8–1.2)
Prothrombin Time: 18.5 s — ABNORMAL HIGH (ref 11.4–15.2)

## 2024-02-19 LAB — BRAIN NATRIURETIC PEPTIDE: B Natriuretic Peptide: 85.4 pg/mL (ref 0.0–100.0)

## 2024-02-19 LAB — ETHANOL: Alcohol, Ethyl (B): <15 mg/dL (ref <15)

## 2024-02-19 SURGERY — INVASIVE LAB ABORTED CASE

## 2024-02-19 MED ORDER — AMIODARONE HCL IN DEXTROSE 360-4.14 MG/200ML-% IV SOLN
30.0000 mg/h | INTRAVENOUS | Status: DC
Start: 2024-02-20 — End: 2024-02-19

## 2024-02-19 MED ORDER — AMIODARONE HCL IN DEXTROSE 360-4.14 MG/200ML-% IV SOLN
30.0000 mg/h | INTRAVENOUS | Status: DC
  Administered 2024-02-20 – 2024-02-23 (×7): 30 mg/h via INTRAVENOUS
  Filled 2024-02-19 (×6): qty 200

## 2024-02-19 MED ORDER — ONDANSETRON HCL 4 MG/2ML IJ SOLN: 4.0000 mg | Freq: Four times a day (QID) | INTRAMUSCULAR | Status: DC | PRN

## 2024-02-19 MED ORDER — PROPOFOL 1000 MG/100ML IV EMUL
INTRAVENOUS | Status: AC
  Filled 2024-02-19: qty 100

## 2024-02-19 MED ORDER — IOHEXOL 350 MG/ML SOLN
75.0000 mL | Freq: Once | INTRAVENOUS | Status: AC | PRN
  Administered 2024-02-19: 75 mL via INTRAVENOUS

## 2024-02-19 MED ORDER — FENTANYL CITRATE (PF) 50 MCG/ML IJ SOSY
50.0000 ug | PREFILLED_SYRINGE | Freq: Once | INTRAMUSCULAR | Status: AC
  Administered 2024-02-19: 50 ug via INTRAVENOUS

## 2024-02-19 MED ORDER — AMIODARONE HCL IN DEXTROSE 360-4.14 MG/200ML-% IV SOLN: 60.0000 mg/h | INTRAVENOUS | Status: DC

## 2024-02-19 MED ORDER — INSULIN ASPART 100 UNIT/ML IJ SOLN
0.0000 [IU] | INTRAMUSCULAR | Status: DC
  Administered 2024-02-19: 1 [IU] via SUBCUTANEOUS
  Administered 2024-02-20: 2 [IU] via SUBCUTANEOUS
  Administered 2024-02-20: 1 [IU] via SUBCUTANEOUS
  Administered 2024-02-20: 2 [IU] via SUBCUTANEOUS
  Administered 2024-02-20: 1 [IU] via SUBCUTANEOUS
  Administered 2024-02-20 (×2): 2 [IU] via SUBCUTANEOUS

## 2024-02-19 MED ORDER — AMIODARONE HCL IN DEXTROSE 360-4.14 MG/200ML-% IV SOLN
60.0000 mg/h | INTRAVENOUS | Status: AC
  Administered 2024-02-19: 60 mg/h via INTRAVENOUS
  Filled 2024-02-19: qty 200

## 2024-02-19 MED ORDER — PROPOFOL 1000 MG/100ML IV EMUL
0.0000 ug/kg/min | INTRAVENOUS | Status: DC
  Administered 2024-02-19: 5 ug/kg/min via INTRAVENOUS
  Administered 2024-02-20: 20 ug/kg/min via INTRAVENOUS
  Administered 2024-02-20 – 2024-02-21 (×5): 50 ug/kg/min via INTRAVENOUS
  Filled 2024-02-19 (×6): qty 100

## 2024-02-19 MED ORDER — DOCUSATE SODIUM 50 MG/5ML PO LIQD: 100.0000 mg | Freq: Two times a day (BID) | ORAL | Status: DC | PRN

## 2024-02-19 MED ORDER — NITROGLYCERIN 1 MG/10 ML FOR IR/CATH LAB
INTRA_ARTERIAL | Status: DC
Start: 2024-02-19 — End: 2024-02-19
  Filled 2024-02-19: qty 10

## 2024-02-19 MED ORDER — NOREPINEPHRINE 4 MG/250ML-% IV SOLN
0.0000 ug/min | INTRAVENOUS | Status: DC
Start: 2024-02-19 — End: 2024-02-19
  Administered 2024-02-19: 15 ug/min via INTRAVENOUS

## 2024-02-19 MED ORDER — CHLORHEXIDINE GLUCONATE CLOTH 2 % EX PADS
6.0000 | MEDICATED_PAD | Freq: Every day | CUTANEOUS | Status: DC
  Administered 2024-02-20 – 2024-02-28 (×10): 6 via TOPICAL

## 2024-02-19 MED ORDER — NOREPINEPHRINE 4 MG/250ML-% IV SOLN
0.0000 ug/min | INTRAVENOUS | Status: DC
  Filled 2024-02-19: qty 250

## 2024-02-19 MED ORDER — HEPARIN SODIUM (PORCINE) 1000 UNIT/ML IJ SOLN
INTRAMUSCULAR | Status: AC
  Filled 2024-02-19: qty 10

## 2024-02-19 MED ORDER — FENTANYL CITRATE (PF) 50 MCG/ML IJ SOSY
PREFILLED_SYRINGE | INTRAMUSCULAR | Status: AC
  Administered 2024-02-19: 50 ug
  Filled 2024-02-19: qty 2

## 2024-02-19 MED ORDER — DOCUSATE SODIUM 100 MG PO CAPS: 100.0000 mg | ORAL_CAPSULE | Freq: Two times a day (BID) | ORAL | Status: DC | PRN

## 2024-02-19 MED ORDER — PANTOPRAZOLE SODIUM 40 MG IV SOLR
40.0000 mg | Freq: Every day | INTRAVENOUS | Status: DC
  Administered 2024-02-19 – 2024-02-28 (×10): 40 mg via INTRAVENOUS
  Filled 2024-02-19 (×10): qty 10

## 2024-02-19 MED ORDER — POLYETHYLENE GLYCOL 3350 17 G PO PACK: 17.0000 g | PACK | Freq: Every day | ORAL | Status: DC | PRN

## 2024-02-19 MED ORDER — VERAPAMIL HCL 2.5 MG/ML IV SOLN
INTRAVENOUS | Status: AC
  Filled 2024-02-19: qty 2

## 2024-02-19 MED ORDER — SODIUM CHLORIDE 0.9 % IV SOLN
250.0000 mL | INTRAVENOUS | Status: DC
  Administered 2024-02-19: 250 mL via INTRAVENOUS

## 2024-02-19 MED ORDER — LIDOCAINE HCL (CARDIAC) PF 100 MG/5ML IV SOSY
0.5000 mg/kg | PREFILLED_SYRINGE | Freq: Once | INTRAVENOUS | Status: AC
  Administered 2024-02-19: 35 mg via INTRAVENOUS
  Filled 2024-02-19: qty 5

## 2024-02-19 NOTE — Procedures (Signed)
 Central Venous Catheter Insertion Procedure Note  [name]  [id number]  06/08/1960  Date:02/19/24  Time:10:41 PM   Provider Performing:Aradia Estey R Arlon Bleier   Procedure: Insertion of Non-tunneled Central Venous Catheter(36556) with US  guidance (23062)   Indication(s) Medication administration  Consent Unable to obtain consent due to emergent nature of procedure.  Anesthesia Topical only with 1% lidocaine    Timeout Verified patient identification, verified procedure, site/side was marked, verified correct patient position, special equipment/implants available, medications/allergies/relevant history reviewed, required imaging and test results available.  Sterile Technique Maximal sterile technique including full sterile barrier drape, hand hygiene, sterile gown, sterile gloves, mask, hair covering, sterile ultrasound probe cover (if used).  Procedure Description Area of catheter insertion was cleaned with chlorhexidine and draped in sterile fashion.  With real-time ultrasound guidance a central venous catheter was placed into the left internal jugular vein. Nonpulsatile blood flow and easy flushing noted in all ports.  The catheter was sutured in place and sterile dressing applied.  Complications/Tolerance None; patient tolerated the procedure well. Chest X-ray is ordered to verify placement for internal jugular or subclavian cannulation.   Chest x-ray is not ordered for femoral cannulation.  EBL Minimal  Specimen(s) None   Leita SAUNDERS Terecia Plaut, PA-C

## 2024-02-19 NOTE — ED Notes (Signed)
 Patient transported to CT

## 2024-02-19 NOTE — Progress Notes (Signed)
   02/19/24 1812  Spiritual Encounters  Type of Visit Initial  Reason for visit Trauma  OnCall Visit Yes   Chaplain responded to Trauma 1 page. Patient was unconscious and was taken to CT scan. No family was present or coming. No spiritual need at this time. Chaplain remains available upon request.  Chaplain Shaelin Lalley

## 2024-02-19 NOTE — Progress Notes (Signed)
 Orthopedic Tech Progress Note Patient Details:  [name] 1960/12/11 [id number]  Patient ID: [name], male   DOB: January 10, 1961, 63 y.o.   MRN: [id number] Level 1 trauma [name] 02/19/2024, 5:54 PM

## 2024-02-19 NOTE — H&P (Signed)
 NAME:  [name], MRN:  [id number], DOB:  [birth year], LOS: 0 ADMISSION DATE:  02/19/2024, CONSULTATION DATE:  02/19/2024 REFERRING MD:  Lonni Sakai, MD, CHIEF COMPLAINT:  Found Down Outside Hospital   History of Present Illness:  63 y/o male with PMH for CAD who had a PCI in 2010 for anterior STEMI and found to have 3 vessel disease and went for CABG in 2010 (LIMA-LAD, SVG-Ramus, SVG -OM, SVG-RPDA), HFrEF, Ischemic Cardiomyopathy-last echo 2011 showing EF 25-30%, with akinesis to dyskinesis of the entire anterior, anteroseptal, and apical myocardium, and cocaine use disorder who was found down outside [facility] with unknown down time and CPR initiated 1655.  Patient noted to be in V fib arrest.  He was shocked approximately 12 times by EMS, IO placed and 450mg  Amiodarone, 6mg  Epinephrine, 1 liter IVF, 2gm Magnesium.  In ED patient on Levophed drip at 15mcg and Amiodarone drip.  He was intubated and sedated.  Code STEMI called, ST elevations in anteriolat leads and I spoke with Dr End, there is a potential ICH and so there is hesitation to take patient to cath lab given Heparin during cath. LA 9.3, Cr 1.30, WBC 13.8, INR 1.5, Glucose 303.  CT head also showing a soft tissue mass in the left maxillary sinus. Pertinent  Medical History  CAD who had a PCI in 2010 for anterior STEMI and found to have 3 vessel disease and went for CABG in 2010 (LIMA-LAD, SVG-Ramus, SVG -OM, SVG-RPDA), HFrEF, Ischemic Cardiomyopahty-last echo 2011 showing EF 25-30%, with akinesis to dyskinesis of the entire anterior, anteroseptal, and apical myocardium, and cocaine use disorder   Significant Hospital Events: Including procedures, antibiotic start and stop dates in addition to other pertinent events   10/24: Admit to ICU  Interim History / Subjective:  N/a  Objective    Blood pressure (!) 122/91, pulse (!) 101, temperature (!) 97.3 F (36.3 C), temperature source Temporal,  resp. rate (!) 22, height 6' 2 (1.88 m), SpO2 100%.    Vent Mode: PRVC FiO2 (%):  [100 %] 100 % Set Rate:  [18 bmp] 18 bmp Vt Set:  [500 mL-650 mL] 650 mL PEEP:  [5 cmH20] 5 cmH20  No intake or output data in the 24 hours ending 02/19/24 1939 There were no vitals filed for this visit.  Examination: General: sedated and intubated HENT: right supraorbital staples and lacerations to face, neck collar in place Lungs: CTA no wheezes no rales Cardiovascular: reg s1s2 no murmurs or rubs Abdomen: soft nt bs pos Extremities: no edema, cyanosis, clubbing, cool to touch Neuro: noon-responsive GU: Foley  Resolved problem list   Assessment and Plan  V. Fib Cardiac arrest Patient shocked 12 times, apparent short duration of cardiac arrest Currently on Amiodarone drip and remain in regular rhythm Normothermic measures There may be anoxic brain injury as well STEMI To go to cath lab if clear by Neurosurgery for ICH Not able to Heparinize at this time Acute Hypoxic Respiratory failure Vent management SAT/SBT when medically appropriate VAP preventive measures AKI Cr 1.30, above his baseline from 2010 Monitor Is/Os Monitor serum Cr Lactic Acidosis Secondary to cardiac arrest HFrEF Repeat echo Inotropic support Ischemic cardiomyopathy Possible cath lab  Possible ICH Awaiting NRSGY to help with interpreting CT head Left Maxillary Sinus Mass ENT evaluation when more stable   Labs   CBC: Recent Labs  Lab 02/19/24 1751 02/19/24 1801  WBC 13.8*  --   HGB 15.0 15.3  HCT  47.7 45.0  MCV 97.5  --   PLT 233  --     Basic Metabolic Panel: Recent Labs  Lab 02/19/24 1801  NA 137  K 3.7  CL 104  GLUCOSE 303*  BUN 22  CREATININE 1.30*   GFR: CrCl cannot be calculated (Unknown ideal weight.). Recent Labs  Lab 02/19/24 1751 02/19/24 1801  WBC 13.8*  --   LATICACIDVEN  --  9.3*    Liver Function Tests: No results for input(s): AST, ALT, ALKPHOS, BILITOT,  PROT, ALBUMIN in the last 168 hours. No results for input(s): LIPASE, AMYLASE in the last 168 hours. No results for input(s): AMMONIA in the last 168 hours.  ABG    Component Value Date/Time   PHART 7.419 02/23/2009 1927   PCO2ART 36.8 02/23/2009 1927   PO2ART 109.0 (H) 02/23/2009 1927   HCO3 23.5 02/23/2009 1927   TCO2 17 (L) 02/19/2024 1801   O2SAT 98.0 02/23/2009 1927     Coagulation Profile: Recent Labs  Lab 02/19/24 1751  INR 1.5*    Cardiac Enzymes: No results for input(s): CKTOTAL, CKMB, CKMBINDEX, TROPONINI in the last 168 hours.  HbA1C: Hgb A1c MFr Bld  Date/Time Value Ref Range Status  02/22/2009 04:00 AM  4.6 - 6.1 % Final   5.3 (NOTE) The ADA recommends the following therapeutic goal for glycemic control related to Hgb A1c measurement: Goal of therapy: <6.5 Hgb A1c  Reference: American Diabetes Association: Clinical Practice Recommendations 2010, Diabetes Care, 2010, 33: (Suppl  1).    CBG: No results for input(s): GLUCAP in the last 168 hours.  Review of Systems:   N/A  Past Medical History:  He,  has a past medical history of Heart disease.   Surgical History:   Past Surgical History:  Procedure Laterality Date   CORONARY STENT PLACEMENT       Social History:   reports that he has been smoking. He does not have any smokeless tobacco history on file. He reports current drug use. He reports that he does not drink alcohol.   Family History:  His family history is not on file.   Allergies No Known Allergies   Home Medications  Prior to Admission medications   Medication Sig Start Date End Date Taking? Authorizing Provider  amoxicillin  (AMOXIL ) 500 MG capsule Take 1,000 mg by mouth 2 (two) times daily.    [provider]  aspirin EC 81 MG tablet Take 81 mg by mouth daily.      [provider]  digoxin (LANOXIN) 0.125 MG tablet Take 125 mcg by mouth daily.      [provider]  fluticasone   (FLONASE ) 50 MCG/ACT nasal spray Place 2 sprays into the nose daily.    [provider]  lidocaine  (LIDODERM ) 5 % Place 1 patch onto the skin daily. Remove & Discard patch within 12 hours or as directed by MD 12/29/19   Joy, Shawn C, PA-C  lisinopril (PRINIVIL,ZESTRIL) 5 MG tablet Take 5 mg by mouth daily.      [provider]  loratadine  (CLARITIN ) 10 MG tablet Take 10 mg by mouth daily.    [provider]  methocarbamol  (ROBAXIN ) 750 MG tablet Take 1 tablet (750 mg total) by mouth 2 (two) times daily as needed for muscle spasms (or muscle tightness). 12/29/19   Joy, Shawn C, PA-C  metoprolol tartrate (LOPRESSOR) 25 MG tablet Take 25 mg by mouth 2 (two) times daily.      [provider]  pravastatin (  PRAVACHOL) 20 MG tablet Take 20 mg by mouth at bedtime.      [provider]     Critical care time: 82   The patient is critically ill with multiple organ system failure and requires high complexity decision making for assessment and support, frequent evaluation and titration of therapies, advanced monitoring, review of radiographic studies and interpretation of complex data.   Critical Care Time devoted to patient care services, exclusive of separately billable procedures, described in this note is 33 minutes.   Orlin Fairly, MD [facility] See Amion for pager  If no response to pager , please call [phone] until 7pm After 7:00 pm call Elink  [phone] 02/19/2024, 7:39 PM

## 2024-02-19 NOTE — Progress Notes (Signed)
 Night shift RT given report, aware of ABG order needing to be collected.

## 2024-02-19 NOTE — Progress Notes (Signed)
 Pt transported to/from CT scan via vent w/ no apparent resp complications.  After return to ED, pt was measured, and VT adjusted to match 8 ml/kg.  Awaiting on orders.

## 2024-02-19 NOTE — ED Provider Notes (Signed)
 [place] EMERGENCY DEPARTMENT AT [facility] Provider Note   CSN: [id number] Arrival date & time: 02/19/24  1742     Patient presents with: No chief complaint on file.   [name] is a 63 y.o. male.   The history is provided by the EMS personnel. The history is limited by the condition of the patient. No language interpreter was used.  Cardiac Arrest Witnessed by:  Not witnessed Time since incident:  30 minutes Time before BLS initiated:  1-2 minutes Condition upon EMS arrival:  Unresponsive Initial cardiac rhythm per EMS:  Ventricular fibrillation Treatments prior to arrival:  Intubation, vascular access, AED discharged and ACLS protocol Medications given prior to ED:  Epinephrine, magnesium and amiodarone Number of shocks delivered:  10 or more (12) Rhythm after defibrillation:  Sinus tachycardia IV access type:  Peripheral Airway:  Intubation prior to arrival Rhythm on admission to ED:  Unchanged Risk factors: head injury and trauma        Prior to Admission medications   Medication Sig Start Date End Date Taking? Authorizing Provider  amoxicillin  (AMOXIL ) 500 MG capsule Take 1,000 mg by mouth 2 (two) times daily.    [provider]  aspirin EC 81 MG tablet Take 81 mg by mouth daily.      [provider]  digoxin (LANOXIN) 0.125 MG tablet Take 125 mcg by mouth daily.      [provider]  fluticasone  (FLONASE ) 50 MCG/ACT nasal spray Place 2 sprays into the nose daily.    [provider]  lidocaine  (LIDODERM ) 5 % Place 1 patch onto the skin daily. Remove & Discard patch within 12 hours or as directed by MD 12/29/19   Joy, Shawn C, PA-C  lisinopril (PRINIVIL,ZESTRIL) 5 MG tablet Take 5 mg by mouth daily.      [provider]  loratadine  (CLARITIN ) 10 MG tablet Take 10 mg by mouth daily.    [provider]  methocarbamol  (ROBAXIN ) 750 MG tablet Take 1 tablet (750 mg total) by mouth 2 (two) times daily as  needed for muscle spasms (or muscle tightness). 12/29/19   Joy, Shawn C, PA-C  metoprolol tartrate (LOPRESSOR) 25 MG tablet Take 25 mg by mouth 2 (two) times daily.      [provider]  pravastatin (PRAVACHOL) 20 MG tablet Take 20 mg by mouth at bedtime.      [provider]    Allergies: Patient has no known allergies.    Review of Systems  Unable to perform ROS: Patient unresponsive  Constitutional:  Negative for fever.  Skin:  Positive for wound.  Neurological:  Positive for syncope.    Updated Vital Signs BP (!) 128/105   Pulse (!) 102   Temp (!) 97.3 F (36.3 C) (Temporal)   Resp (!) 30   Ht 6' 2 (1.88 m)   Wt 70 kg   SpO2 100%   BMI 19.81 kg/m   Physical Exam Vitals and nursing note reviewed.  Constitutional:      General: He is in acute distress.     Appearance: He is ill-appearing.     Interventions: He is intubated. Cervical collar in place.  HENT:     Head: Abrasion, contusion and laceration present.      Comments: Pupils symmetric about 2 mm and sluggish bilaterally.    Mouth/Throat:     Mouth: Mucous membranes are dry.  Eyes:     Pupils: Pupils are equal, round, and reactive to  light.  Neck:     Comments: In c collar Cardiovascular:     Rate and Rhythm: Tachycardia present.  Pulmonary:     Effort: He is intubated.     Breath sounds: Rhonchi present.  Chest:    Abdominal:     Tenderness: There is no abdominal tenderness.  Skin:    Findings: Bruising present.  Neurological:     Mental Status: He is unresponsive.     GCS: GCS eye subscore is 1. GCS verbal subscore is 1. GCS motor subscore is 1.     (all labs ordered are listed, but only abnormal results are displayed) Labs Reviewed  COMPREHENSIVE METABOLIC PANEL WITH GFR - Abnormal; Notable for the following components:      Result Value   CO2 14 (*)    Glucose, Bld 304 (*)    Creatinine, Ser 1.42 (*)    Calcium 7.9 (*)    Total Protein 5.6 (*)    Albumin 3.1 (*)    AST  80 (*)    GFR, Estimated 56 (*)    Anion gap 19 (*)    All other components within normal limits  CBC - Abnormal; Notable for the following components:   WBC 13.8 (*)    nRBC 0.4 (*)    All other components within normal limits  PROTIME-INR - Abnormal; Notable for the following components:   Prothrombin Time 18.5 (*)    INR 1.5 (*)    All other components within normal limits  I-STAT CHEM 8, ED - Abnormal; Notable for the following components:   Creatinine, Ser 1.30 (*)    Glucose, Bld 303 (*)    Calcium, Ion 1.02 (*)    TCO2 17 (*)    All other components within normal limits  I-STAT CG4 LACTIC ACID, ED - Abnormal; Notable for the following components:   Lactic Acid, Venous 9.3 (*)    All other components within normal limits  I-STAT ARTERIAL BLOOD GAS, ED - Abnormal; Notable for the following components:   pH, Arterial 7.252 (*)    pO2, Arterial 155 (*)    Bicarbonate 15.7 (*)    TCO2 17 (*)    Acid-base deficit 11.0 (*)    Sodium 134 (*)    Calcium, Ion 1.11 (*)    HCT 54.0 (*)    Hemoglobin 18.4 (*)    All other components within normal limits  TROPONIN I (HIGH SENSITIVITY) - Abnormal; Notable for the following components:   Troponin I (High Sensitivity) 173 (*)    All other components within normal limits  TROPONIN I (HIGH SENSITIVITY) - Abnormal; Notable for the following components:   Troponin I (High Sensitivity) 5,970 (*)    All other components within normal limits  TROPONIN I (HIGH SENSITIVITY) - Abnormal; Notable for the following components:   Troponin I (High Sensitivity) 15,390 (*)    All other components within normal limits  MRSA NEXT GEN BY PCR, NASAL  ETHANOL  BRAIN NATRIURETIC PEPTIDE  URINALYSIS, ROUTINE W REFLEX MICROSCOPIC  BLOOD GAS, ARTERIAL  CBC  BASIC METABOLIC PANEL WITH GFR  BLOOD GAS, ARTERIAL  MAGNESIUM  PHOSPHORUS  HEMOGLOBIN A1C  URINE DRUGS OF ABUSE SCREEN W ALC, ROUTINE (REF LAB)  HIV ANTIBODY (ROUTINE TESTING W REFLEX)  RAPID  URINE DRUG SCREEN, HOSP PERFORMED  TYPE AND SCREEN    EKG: EKG Interpretation Date/Time:  Friday February 19 2024 17:57:51 EDT Ventricular Rate:  66 PR Interval:  462 QRS Duration:  152 QT Interval:  448 QTC Calculation: 470 R Axis:   127  Text Interpretation: Sinus or ectopic atrial rhythm Prolonged PR interval Right bundle branch block Lateral infarct, age indeterminate more wandering baseline than prior ** ** ACUTE MI / STEMI ** ** Confirmed by Ginger Barefoot ([id number]) on 02/19/2024 6:30:09 PM  Radiology: [facility] Abd Portable 1V Result Date: 02/19/2024 EXAM: 1 VIEW XRAY OF THE ABDOMEN 02/19/2024 07:17:16 PM COMPARISON: CT chest abdomen and pelvis 02/19/2011. CLINICAL HISTORY: [id number] Trauma [id number]. OG tube placement. FINDINGS: LINES, TUBES AND DEVICES: Or gastric tube tip is in the gastric fundus. BOWEL: Nonobstructive bowel gas pattern. SOFT TISSUES: No opaque urinary calculi. BONES: No acute osseous abnormality. IMPRESSION: 1. OG tube tip is in the gastric fundus. Electronically signed by: Greig Pique MD 02/19/2024 07:27 PM EDT RP Workstation: [id number]   CT CHEST ABDOMEN PELVIS W CONTRAST Result Date: 02/19/2024 CLINICAL DATA:  Level 1 trauma.  Post CPR. EXAM: CT CHEST, ABDOMEN, AND PELVIS WITH CONTRAST TECHNIQUE: Multidetector CT imaging of the chest, abdomen and pelvis was performed following the standard protocol during bolus administration of intravenous contrast. RADIATION DOSE REDUCTION: This exam was performed according to the departmental dose-optimization program which includes automated exposure control, adjustment of the mA and/or kV according to patient size and/or use of iterative reconstruction technique. CONTRAST:  75mL OMNIPAQUE IOHEXOL 350 MG/ML SOLN COMPARISON:  None Available. FINDINGS: CT CHEST FINDINGS Motion artifact limitations. Cardiovascular: No evidence of acute aortic injury. No central pulmonary embolus. The heart is enlarged. There are coronary artery  calcifications. No pericardial effusion. Mediastinum/Nodes: No mediastinal hemorrhage or hematoma. Small amount of fluid in the esophagus. Small hiatal hernia. No pneumomediastinum. Soft tissue density in the right hilum likely represents lymph nodes. The endotracheal tube tip is above the carina. Lungs/Pleura: Mild emphysema. Dependent opacities within the upper and lower lobes as well as extending along the fissures, query aspiration. More diffuse ground-glass opacities and septal thickening likely pulmonary edema. No pneumothorax. No large pleural effusion. Musculoskeletal: Nondisplaced right anterior fifth rib fracture. Status post median sternotomy without sternal fracture. Mild degenerative change in the thoracic spine, no thoracic spine fracture. The included clavicles and shoulder girdles are intact. No confluent chest wall contusion. CT ABDOMEN PELVIS FINDINGS Hepatobiliary: No hepatic injury or perihepatic hematoma. Subcentimeter hypodensities in the right lobe of the liver are too small to characterize but likely cysts. Gallbladder is decompressed and not well assessed. Pancreas: No evidence of injury. No ductal dilatation or inflammation. Spleen: No splenic injury or perisplenic hematoma. Adrenals/Urinary Tract: No adrenal hemorrhage or renal injury identified. Left renal atrophy. No hydronephrosis. Bladder is minimally distended, not well assessed. Stomach/Bowel: Questionable area of small bowel wall thickening in the proximal small bowel/mid abdomen, series 3, image 83, but no associated mesenteric edema or free fluid. Fluid throughout the majority of small bowel. No obstruction. Moderate volume of colonic stool. The colon is redundant. Vascular/Lymphatic: No evidence of vascular injury. Aortic atherosclerosis without aneurysm. No retroperitoneal fluid. No adenopathy. Reproductive: Enlarged prostate spans 5 cm. Other: No free air or free fluid. Small bilateral fat containing inguinal hernias. No  confluent body wall contusion. Musculoskeletal: Mild degenerative change in the lumbar spine. No lumbar spine fracture. No pelvic fracture. IMPRESSION: 1. Nondisplaced right anterior fifth rib fracture. No pneumothorax. 2. Dependent opacities within the upper and lower lobes as well as extending along the fissures, query aspiration. More diffuse ground-glass opacities and septal thickening likely pulmonary edema. 3. Questionable area of small bowel wall thickening in the proximal small bowel/mid abdomen, but  no associated mesenteric edema or free fluid. Etiology is indeterminate. Consider follow-up exam with enteric contrast should patient have abdominal symptoms. 4. No additional acute traumatic injury to the abdomen or pelvis. 5. Enlarged prostate. Aortic Atherosclerosis (ICD10-I70.0) and Emphysema (ICD10-J43.9). Electronically Signed   By: Andrea Gasman M.D.   On: 02/19/2024 19:01   CT CERVICAL SPINE WO CONTRAST Result Date: 02/19/2024 CLINICAL DATA:  Blunt trauma, level 1 trauma. EXAM: CT CERVICAL SPINE WITHOUT CONTRAST TECHNIQUE: Multidetector CT imaging of the cervical spine was performed without intravenous contrast. Multiplanar CT image reconstructions were also generated. RADIATION DOSE REDUCTION: This exam was performed according to the departmental dose-optimization program which includes automated exposure control, adjustment of the mA and/or kV according to patient size and/or use of iterative reconstruction technique. COMPARISON:  None Available. FINDINGS: Alignment: Normal. Skull base and vertebrae: Motion artifact limitations. Allowing for this, no acute fracture. The dens is intact. Soft tissues and spinal canal: Pharyngeal fluid surrounding endotracheal tube. No canal hematoma. Disc levels: Degenerative disc disease C4-C5 through C6-C7. Multilevel facet hypertrophy. Upper chest: Assessed on concurrent chest CT, reported separately. Other: Endotracheal tube in place. IMPRESSION: 1. Motion  artifact limitations. Allowing for this, no acute fracture or subluxation of the cervical spine. 2. Multilevel degenerative disc disease and facet hypertrophy. Electronically Signed   By: Andrea Gasman M.D.   On: 02/19/2024 18:51   CT HEAD WO CONTRAST Result Date: 02/19/2024 CLINICAL DATA:  Level 1 trauma.  Head trauma. EXAM: CT HEAD WITHOUT CONTRAST TECHNIQUE: Contiguous axial images were obtained from the base of the skull through the vertex without intravenous contrast. RADIATION DOSE REDUCTION: This exam was performed according to the departmental dose-optimization program which includes automated exposure control, adjustment of the mA and/or kV according to patient size and/or use of iterative reconstruction technique. COMPARISON:  Head CT 12/29/2019 FINDINGS: Brain: Moderate motion artifact limitations. High-density along the superior falx consistent with dural calcifications was present on prior exam, some of this appears slightly thicker than prior, however this is motion obscured. No other intracranial hemorrhage. Cerebellar greater than cerebral atrophy. Allowing for motion, no evidence of cerebral edema. No midline shift or evidence of mass lesion/mass effect. Vascular: Atherosclerosis of skullbase vasculature without hyperdense vessel or abnormal calcification. Skull: No skull fracture, allowing for motion. Sinuses/Orbits: Expansile soft tissue lesion in the left maxillary sinus which erodes the anterior, medial, and posterolateral walls was present on prior exam but increased from prior. This extends to the nasal septum in causes mild rightward nasal septal bowing. There is extension into the ethmoid air cells. Mucosal thickening of left side of sphenoid sinus. No mastoid effusion. Other: None. IMPRESSION: 1. Moderate motion artifact limitations. 2. High-density along the superior falx consistent with dural calcifications was present on prior exam, some of this appears slightly thicker than  prior. This is favored to represent dural calcifications exacerbated by motion, however a small amount of subdural hemorrhage is difficult to exclude. Recommend short interval follow-up head CT. 3. Expansile soft tissue lesion in the left maxillary sinus which erodes the anterior, medial, and posterolateral walls was present on prior exam but increased in size from prior. This extends to the nasal septum in causes mild rightward nasal septal bowing. There is extension into the ethmoid air cells. Recommend elective ENT consultation. These results were called by telephone at the time of interpretation on 02/19/2024 at 6:47 pm to provider Dr Teresa, who verbally acknowledged these results. Electronically Signed   By: Andrea Gasman HERO.D.  On: 02/19/2024 18:47   DG Chest Port 1 View Result Date: 02/19/2024 CLINICAL DATA:  Trauma, post CPR. EXAM: PORTABLE CHEST 1 VIEW COMPARISON:  12/29/2019 FINDINGS: Lung apices are excluded from the field of view. The endotracheal tube tip is 6.4 cm from the carina. Prior median sternotomy. The heart is mildly enlarged but stable. Post CABG. Mild diffuse pulmonary edema. No confluent opacity, large pleural effusion or pneumothorax. No grossly displaced rib fracture. IMPRESSION: 1. Endotracheal tube tip 6.4 cm from the carina. 2. Mild pulmonary edema. Electronically Signed   By: Andrea Gasman M.D.   On: 02/19/2024 18:21   DG Pelvis Portable Result Date: 02/19/2024 CLINICAL DATA:  Status post trauma, CPR. EXAM: PORTABLE PELVIS 1-2 VIEWS COMPARISON:  None Available. FINDINGS: The cortical margins of the bony pelvis are intact. No fracture. Pubic symphysis and sacroiliac joints are congruent. Both femoral heads are well-seated in the respective acetabula. IMPRESSION: No pelvic fracture. Electronically Signed   By: Andrea Gasman M.D.   On: 02/19/2024 18:20     .Laceration Repair  Date/Time: 02/19/2024 7:51 PM  Performed by: Ginger Lonni PARAS, MD Authorized by:  Ginger Lonni PARAS, MD   Consent:    Consent obtained:  Emergent situation Universal protocol:    Immediately prior to procedure, a time out was called: yes     Patient identity confirmed:  Anonymous protocol, patient vented/unresponsive Anesthesia:    Anesthesia method:  None Laceration details:    Location:  Face   Face location:  L upper eyelid   Extent:  Partial thickness   Length (cm):  1   Depth (mm):  2 Pre-procedure details:    Preparation:  Imaging obtained to evaluate for foreign bodies and patient was prepped and draped in usual sterile fashion Exploration:    Contaminated: no   Treatment:    Area cleansed with:  Chlorhexidine and saline   Amount of cleaning:  Standard Skin repair:    Repair method:  Sutures   Suture size:  5-0   Suture material:  Prolene   Suture technique:  Simple interrupted   Number of sutures:  3 Approximation:    Approximation:  Close Repair type:    Repair type:  Simple Post-procedure details:    Dressing:  Open (no dressing)   Procedure completion:  Tolerated .Laceration Repair  Date/Time: 02/19/2024 7:52 PM  Performed by: Ginger Lonni PARAS, MD Authorized by: Ginger Lonni PARAS, MD   Consent:    Consent obtained:  Emergent situation Laceration details:    Length (cm):  0.5   Depth (mm):  1 Pre-procedure details:    Preparation:  Patient was prepped and draped in usual sterile fashion and imaging obtained to evaluate for foreign bodies Treatment:    Area cleansed with:  Chlorhexidine and saline   Amount of cleaning:  Standard Skin repair:    Repair method:  Sutures   Suture size:  5-0   Suture material:  Prolene   Suture technique:  Simple interrupted   Number of sutures:  2 Approximation:    Approximation:  Close Repair type:    Repair type:  Simple Post-procedure details:    Dressing:  Open (no dressing)   Procedure completion:  Tolerated .Laceration Repair  Date/Time: 02/19/2024 7:53 PM  Performed  by: Ginger Lonni PARAS, MD Authorized by: Ginger Lonni PARAS, MD   Consent:    Consent obtained:  Emergent situation Laceration details:    Location: right temple.   Length (cm):  5   Depth (  mm):  3 Pre-procedure details:    Preparation:  Imaging obtained to evaluate for foreign bodies and patient was prepped and draped in usual sterile fashion Treatment:    Area cleansed with:  Chlorhexidine and saline   Amount of cleaning:  Standard   Irrigation solution:  Sterile saline Skin repair:    Repair method:  Staples   Number of staples:  7 Approximation:    Approximation:  Close Repair type:    Repair type:  Simple Post-procedure details:    Dressing:  Open (no dressing)    CRITICAL CARE Performed by: Lonni PARAS Nandana Krolikowski Total critical care time: 55 minutes Critical care time was exclusive of separately billable procedures and treating other patients. Critical care was necessary to treat or prevent imminent or life-threatening deterioration. Critical care was time spent personally by me on the following activities: development of treatment plan with patient and/or surrogate as well as nursing, discussions with consultants, evaluation of patient's response to treatment, examination of patient, obtaining history from patient or surrogate, ordering and performing treatments and interventions, ordering and review of laboratory studies, ordering and review of radiographic studies, pulse oximetry and re-evaluation of patient's condition.  Medications Ordered in the ED  amiodarone (NEXTERONE PREMIX) 360-4.14 MG/200ML-% (1.8 mg/mL) IV infusion (60 mg/hr Intravenous New Bag/Given 02/19/24 1919)  amiodarone (NEXTERONE PREMIX) 360-4.14 MG/200ML-% (1.8 mg/mL) IV infusion (has no administration in time range)  propofol (DIPRIVAN) 1000 MG/100ML infusion (10 mcg/kg/min  70 kg Intravenous Rate/Dose Change 02/19/24 2027)  Chlorhexidine Gluconate Cloth 2 % PADS 6 each (has no administration  in time range)  polyethylene glycol (MIRALAX / GLYCOLAX) packet 17 g (has no administration in time range)  pantoprazole (PROTONIX) injection 40 mg (has no administration in time range)  ondansetron (ZOFRAN) injection 4 mg (has no administration in time range)  0.9 %  sodium chloride infusion (has no administration in time range)  insulin aspart (novoLOG) injection 0-9 Units (has no administration in time range)  norepinephrine (LEVOPHED) 4mg  in (0.016 mg/mL) premix infusion (15 mcg/min Intravenous Rate/Dose Change 02/19/24 2100)  docusate (COLACE) 50 MG/5ML liquid 100 mg (has no administration in time range)  fentaNYL (SUBLIMAZE) 50 MCG/ML injection (50 mcg  Given 02/19/24 1848)  iohexol (OMNIPAQUE) 350 MG/ML injection 75 mL (75 mLs Intravenous Contrast Given 02/19/24 1837)  fentaNYL (SUBLIMAZE) injection 50 mcg (50 mcg Intravenous Given 02/19/24 1852)                                    Medical Decision Making Amount and/or Complexity of Data Reviewed Labs: ordered. Radiology: ordered.  Risk Prescription drug management. Decision regarding hospitalization.    [name] is a 63 y.o. male with a past medical history significant for CAD with previous PCI who presents for cardiac arrest and head injury.  According to EMS report, patient was near [facility] and was found down with head injury, bleeding, and unresponsiveness.  He reportedly was likely down for less than 5 minutes.  He had CPR started immediately and EMS was able to provide initial defibrillation within several minutes of the call.  Patient had approximately 12 defibrillation's for V-fib arrest.  He had multiple dose of epinephrine, amiodarone drips, and was given mag.  They obtained ROSC 3 times including right before arrival to the emergency department.  Total CPR time on and off was around 30 minutes or less.  Unknown if the patient had  chest pain and then fell and hit his head or if it was a head trauma before  the fall but as he is in a c-collar and has evidence of head injury with hypotension and cardiac arrest he was activated as a level 1 trauma.  Once we got into a room and start examining him EKG was performed and it is concerning for STEMI.  Code STEMI also activated.  On my exam breath sounds are symmetric and equal bilaterally.  Chest and abdomen do not seem tender.  He has injury to his left forehead and face which we will clean and evaluate after he gets further imaging.  Portal images did not show a large pneumothorax initially.  Patient taken to CT scanner for imaging and cardiology has arrived.  Will get repeat EKG after he returns from CT scanner.  This will determine disposition.  According to law enforcement there is no family or known contact at this time.  After return to CT scanner, given the possibility of going to the Cath Lab for STEMI, trauma recommended we closed the lacerations on the face so they would not bleed if he is heparinized.  They felt that it was appropriate to do staples and staples were placed in his right temporal laceration but the left side lacerations were on the upper eyelid/lower eyebrow and the left lower eyelid area.  Sutures were placed there instead of staples.  Both trauma surgery and cardiology would like me to consult neurosurgery given this possible intracranial subdural hemorrhage on CT as this would preclude him from going to the Cath Lab.  Critical care was also called for admission.  Neurosurgery reviewed the images and tell me that they think it is likely not acute intracerebral hemorrhage and the patient will be able to go to the Cath Lab if felt necessary however if the patient did get anticoagulated they would recommend a repeat CT after anticoagulation initiation and a procedure.  Cardiologist they will hold on Cath Lab at this time and admit to critical care.  Critical care will admit.      Final diagnoses:  Cardiac arrest [facility]   Traumatic injury of head, initial encounter    Clinical Impression: 1. Cardiac arrest (HCC)   2. Traumatic injury of head, initial encounter     Disposition: Admit  This note was prepared with assistance of Dragon voice recognition software. Occasional wrong-word or sound-a-like substitutions may have occurred due to the inherent limitations of voice recognition software.        Kalee Broxton, Lonni PARAS, MD 02/19/24 2237

## 2024-02-19 NOTE — ED Notes (Signed)
 Called DR  END CODE STEMI, PER TEGELER

## 2024-02-19 NOTE — ED Notes (Signed)
 Pt arrived via EMS. PT was found down outside of [facility] and [facility] on this campus. Unknown down time. Pt was unresponsive when found. CPR was started at 1655. Per ems ROSC was obtained 3 times but first 2 lasted approx 30 seconds and then was cardioverted. PT has IO in right humerus. Was given 450 of Amio for VFIB, 6 Epi, 2g mag, 1000ml fluid. Pt arrived intubated with pulses present. Pt has dried blood above both eyes. No active bleeding. Pt has c collar in placed.

## 2024-02-19 NOTE — ED Notes (Signed)
 PT given fentanyl 100mcg in CT

## 2024-02-19 NOTE — Consult Note (Signed)
 Activation and Reason: Level 1, found down, vfib arrest, cpr, lac to forehead  Primary Survey:  Airway: secured by to my arrival by Dr. Ginger Breathing: bilateral breath sounds Circulation: intact pulses in extremities Disability: GCS 3T  HPI: [name] is an 63 y.o. male with hx of HTN, HLD, CAD by report was found down on sidewalk outside hospital - no pulse on ems arriva --> CPR. Arrived with concerns for vfib arrest/ STEMI but was found to have head lac and unwitnessed fall, thererfore level 1 activated.    Past Medical History:  Diagnosis Date   Heart disease     Past Surgical History:  Procedure Laterality Date   CORONARY STENT PLACEMENT      No family history on file.  Social:  reports that he has been smoking. He does not have any smokeless tobacco history on file. He reports current drug use. He reports that he does not drink alcohol.  Allergies: No Known Allergies  Medications: I have reviewed the patient's current medications.  Results for orders placed or performed during the hospital encounter of 02/19/24 (from the past 48 hours)  Type and screen [facility]     Status: None   Collection Time: 02/19/24  5:50 PM  Result Value Ref Range   ABO/RH(D) O NEG    Antibody Screen NEG    Sample Expiration      02/22/2024,2359 Performed at [facility], [address], [place], [place] [address]   CBC     Status: Abnormal   Collection Time: 02/19/24  5:51 PM  Result Value Ref Range   WBC 13.8 (H) 4.0 - 10.5 K/uL   RBC 4.89 4.22 - 5.81 MIL/uL   Hemoglobin 15.0 13.0 - 17.0 g/dL   HCT 52.2 60.9 - 47.9 %   MCV 97.5 80.0 - 100.0 fL   MCH 30.7 26.0 - 34.0 pg   MCHC 31.4 30.0 - 36.0 g/dL   RDW 87.1 88.4 - 84.4 %   Platelets 233 150 - 400 K/uL   nRBC 0.4 (H) 0.0 - 0.2 %    Comment: Performed at [facility], [address], [place], [place] [address]  Ethanol     Status: None   Collection Time: 02/19/24  5:51 PM  Result Value  Ref Range   Alcohol, Ethyl (B) <15 <15 mg/dL    Comment: (NOTE) For medical purposes only. Performed at [facility], [address], [place], [place] [address]   Protime-INR     Status: Abnormal   Collection Time: 02/19/24  5:51 PM  Result Value Ref Range   Prothrombin Time 18.5 (H) 11.4 - 15.2 seconds   INR 1.5 (H) 0.8 - 1.2    Comment: (NOTE) INR goal varies based on device and disease states. Performed at [facility], [address] [place], [place] [address]   I-Stat Chem 8, ED     Status: Abnormal   Collection Time: 02/19/24  6:01 PM  Result Value Ref Range   Sodium 137 135 - 145 mmol/L   Potassium 3.7 3.5 - 5.1 mmol/L   Chloride 104 98 - 111 mmol/L   BUN 22 8 - 23 mg/dL   Creatinine, Ser 8.69 (H) 0.61 - 1.24 mg/dL   Glucose, Bld 696 (H) 70 - 99 mg/dL    Comment: Glucose reference range applies only to samples taken after fasting for at least 8 hours.   Calcium, Ion 1.02 (L)  1.15 - 1.40 mmol/L   TCO2 17 (L) 22 - 32 mmol/L   Hemoglobin 15.3 13.0 - 17.0 g/dL   HCT 54.9 60.9 - 47.9 %  I-Stat Lactic Acid, ED     Status: Abnormal   Collection Time: 02/19/24  6:01 PM  Result Value Ref Range   Lactic Acid, Venous 9.3 (HH) 0.5 - 1.9 mmol/L   Comment NOTIFIED PHYSICIAN     DG Abd Portable 1V Result Date: 02/19/2024 EXAM: 1 VIEW XRAY OF THE ABDOMEN 02/19/2024 07:17:16 PM COMPARISON: CT chest abdomen and pelvis 02/19/2011. CLINICAL HISTORY: 892438 Trauma [id number]. OG tube placement. FINDINGS: LINES, TUBES AND DEVICES: Or gastric tube tip is in the gastric fundus. BOWEL: Nonobstructive bowel gas pattern. SOFT TISSUES: No opaque urinary calculi. BONES: No acute osseous abnormality. IMPRESSION: 1. OG tube tip is in the gastric fundus. Electronically signed by: Greig Pique MD 02/19/2024 07:27 PM EDT RP Workstation: [id number]   CT CHEST ABDOMEN PELVIS W CONTRAST Result Date: 02/19/2024 CLINICAL DATA:  Level 1 trauma.  Post CPR. EXAM: CT CHEST, ABDOMEN, AND PELVIS WITH  CONTRAST TECHNIQUE: Multidetector CT imaging of the chest, abdomen and pelvis was performed following the standard protocol during bolus administration of intravenous contrast. RADIATION DOSE REDUCTION: This exam was performed according to the departmental dose-optimization program which includes automated exposure control, adjustment of the mA and/or kV according to patient size and/or use of iterative reconstruction technique. CONTRAST:  75mL OMNIPAQUE IOHEXOL 350 MG/ML SOLN COMPARISON:  None Available. FINDINGS: CT CHEST FINDINGS Motion artifact limitations. Cardiovascular: No evidence of acute aortic injury. No central pulmonary embolus. The heart is enlarged. There are coronary artery calcifications. No pericardial effusion. Mediastinum/Nodes: No mediastinal hemorrhage or hematoma. Small amount of fluid in the esophagus. Small hiatal hernia. No pneumomediastinum. Soft tissue density in the right hilum likely represents lymph nodes. The endotracheal tube tip is above the carina. Lungs/Pleura: Mild emphysema. Dependent opacities within the upper and lower lobes as well as extending along the fissures, query aspiration. More diffuse ground-glass opacities and septal thickening likely pulmonary edema. No pneumothorax. No large pleural effusion. Musculoskeletal: Nondisplaced right anterior fifth rib fracture. Status post median sternotomy without sternal fracture. Mild degenerative change in the thoracic spine, no thoracic spine fracture. The included clavicles and shoulder girdles are intact. No confluent chest wall contusion. CT ABDOMEN PELVIS FINDINGS Hepatobiliary: No hepatic injury or perihepatic hematoma. Subcentimeter hypodensities in the right lobe of the liver are too small to characterize but likely cysts. Gallbladder is decompressed and not well assessed. Pancreas: No evidence of injury. No ductal dilatation or inflammation. Spleen: No splenic injury or perisplenic hematoma. Adrenals/Urinary Tract: No  adrenal hemorrhage or renal injury identified. Left renal atrophy. No hydronephrosis. Bladder is minimally distended, not well assessed. Stomach/Bowel: Questionable area of small bowel wall thickening in the proximal small bowel/mid abdomen, series 3, image 83, but no associated mesenteric edema or free fluid. Fluid throughout the majority of small bowel. No obstruction. Moderate volume of colonic stool. The colon is redundant. Vascular/Lymphatic: No evidence of vascular injury. Aortic atherosclerosis without aneurysm. No retroperitoneal fluid. No adenopathy. Reproductive: Enlarged prostate spans 5 cm. Other: No free air or free fluid. Small bilateral fat containing inguinal hernias. No confluent body wall contusion. Musculoskeletal: Mild degenerative change in the lumbar spine. No lumbar spine fracture. No pelvic fracture. IMPRESSION: 1. Nondisplaced right anterior fifth rib fracture. No pneumothorax. 2. Dependent opacities within the upper and lower lobes as well as extending along the fissures, query aspiration. More diffuse ground-glass opacities  and septal thickening likely pulmonary edema. 3. Questionable area of small bowel wall thickening in the proximal small bowel/mid abdomen, but no associated mesenteric edema or free fluid. Etiology is indeterminate. Consider follow-up exam with enteric contrast should patient have abdominal symptoms. 4. No additional acute traumatic injury to the abdomen or pelvis. 5. Enlarged prostate. Aortic Atherosclerosis (ICD10-I70.0) and Emphysema (ICD10-J43.9). Electronically Signed   By: Andrea Gasman M.D.   On: 02/19/2024 19:01   CT CERVICAL SPINE WO CONTRAST Result Date: 02/19/2024 CLINICAL DATA:  Blunt trauma, level 1 trauma. EXAM: CT CERVICAL SPINE WITHOUT CONTRAST TECHNIQUE: Multidetector CT imaging of the cervical spine was performed without intravenous contrast. Multiplanar CT image reconstructions were also generated. RADIATION DOSE REDUCTION: This exam was  performed according to the departmental dose-optimization program which includes automated exposure control, adjustment of the mA and/or kV according to patient size and/or use of iterative reconstruction technique. COMPARISON:  None Available. FINDINGS: Alignment: Normal. Skull base and vertebrae: Motion artifact limitations. Allowing for this, no acute fracture. The dens is intact. Soft tissues and spinal canal: Pharyngeal fluid surrounding endotracheal tube. No canal hematoma. Disc levels: Degenerative disc disease C4-C5 through C6-C7. Multilevel facet hypertrophy. Upper chest: Assessed on concurrent chest CT, reported separately. Other: Endotracheal tube in place. IMPRESSION: 1. Motion artifact limitations. Allowing for this, no acute fracture or subluxation of the cervical spine. 2. Multilevel degenerative disc disease and facet hypertrophy. Electronically Signed   By: Andrea Gasman M.D.   On: 02/19/2024 18:51   CT HEAD WO CONTRAST Result Date: 02/19/2024 CLINICAL DATA:  Level 1 trauma.  Head trauma. EXAM: CT HEAD WITHOUT CONTRAST TECHNIQUE: Contiguous axial images were obtained from the base of the skull through the vertex without intravenous contrast. RADIATION DOSE REDUCTION: This exam was performed according to the departmental dose-optimization program which includes automated exposure control, adjustment of the mA and/or kV according to patient size and/or use of iterative reconstruction technique. COMPARISON:  Head CT 12/29/2019 FINDINGS: Brain: Moderate motion artifact limitations. High-density along the superior falx consistent with dural calcifications was present on prior exam, some of this appears slightly thicker than prior, however this is motion obscured. No other intracranial hemorrhage. Cerebellar greater than cerebral atrophy. Allowing for motion, no evidence of cerebral edema. No midline shift or evidence of mass lesion/mass effect. Vascular: Atherosclerosis of skullbase vasculature  without hyperdense vessel or abnormal calcification. Skull: No skull fracture, allowing for motion. Sinuses/Orbits: Expansile soft tissue lesion in the left maxillary sinus which erodes the anterior, medial, and posterolateral walls was present on prior exam but increased from prior. This extends to the nasal septum in causes mild rightward nasal septal bowing. There is extension into the ethmoid air cells. Mucosal thickening of left side of sphenoid sinus. No mastoid effusion. Other: None. IMPRESSION: 1. Moderate motion artifact limitations. 2. High-density along the superior falx consistent with dural calcifications was present on prior exam, some of this appears slightly thicker than prior. This is favored to represent dural calcifications exacerbated by motion, however a small amount of subdural hemorrhage is difficult to exclude. Recommend short interval follow-up head CT. 3. Expansile soft tissue lesion in the left maxillary sinus which erodes the anterior, medial, and posterolateral walls was present on prior exam but increased in size from prior. This extends to the nasal septum in causes mild rightward nasal septal bowing. There is extension into the ethmoid air cells. Recommend elective ENT consultation. These results were called by telephone at the time of interpretation on 02/19/2024 at 6:47  pm to provider Dr Teresa, who verbally acknowledged these results. Electronically Signed   By: Andrea Gasman M.D.   On: 02/19/2024 18:47   DG Chest Port 1 View Result Date: 02/19/2024 CLINICAL DATA:  Trauma, post CPR. EXAM: PORTABLE CHEST 1 VIEW COMPARISON:  12/29/2019 FINDINGS: Lung apices are excluded from the field of view. The endotracheal tube tip is 6.4 cm from the carina. Prior median sternotomy. The heart is mildly enlarged but stable. Post CABG. Mild diffuse pulmonary edema. No confluent opacity, large pleural effusion or pneumothorax. No grossly displaced rib fracture. IMPRESSION: 1. Endotracheal tube  tip 6.4 cm from the carina. 2. Mild pulmonary edema. Electronically Signed   By: Andrea Gasman M.D.   On: 02/19/2024 18:21   DG Pelvis Portable Result Date: 02/19/2024 CLINICAL DATA:  Status post trauma, CPR. EXAM: PORTABLE PELVIS 1-2 VIEWS COMPARISON:  None Available. FINDINGS: The cortical margins of the bony pelvis are intact. No fracture. Pubic symphysis and sacroiliac joints are congruent. Both femoral heads are well-seated in the respective acetabula. IMPRESSION: No pelvic fracture. Electronically Signed   By: Andrea Gasman M.D.   On: 02/19/2024 18:20    ROS -Unable to obtain due to condition of patient   PE Blood pressure (!) 128/105, pulse (!) 102, temperature (!) 97.3 F (36.3 C), temperature source Temporal, resp. rate (!) 30, height 6' 2 (1.88 m), weight 70 kg, SpO2 100%. Physical Exam Constitutional: Intubated, no visible extremity deformities. +Left forehead lac ~2 cm, hemostatic. Eyes: Moist conjunctiva; anicteric; 2 mm, sluggish Neck: Trachea midline; no thyromegaly Lungs: Normal respiratory effort; CTAB; no tactile fremitus CV: no palpable thrills; no pitting edema GI: Abd soft, nondistended; no palpable hepatosplenomegaly MSK:no clubbing/cyanosis; no deformities Psychiatric: unable to assess  Results for orders placed or performed during the hospital encounter of 02/19/24 (from the past 48 hours)  Type and screen [facility]     Status: None   Collection Time: 02/19/24  5:50 PM  Result Value Ref Range   ABO/RH(D) O NEG    Antibody Screen NEG    Sample Expiration      02/22/2024,2359 Performed at [facility], [address], [place], [place] [address]   CBC     Status: Abnormal   Collection Time: 02/19/24  5:51 PM  Result Value Ref Range   WBC 13.8 (H) 4.0 - 10.5 K/uL   RBC 4.89 4.22 - 5.81 MIL/uL   Hemoglobin 15.0 13.0 - 17.0 g/dL   HCT 52.2 60.9 - 47.9 %   MCV 97.5 80.0 - 100.0 fL   MCH 30.7 26.0 - 34.0 pg   MCHC 31.4 30.0 -  36.0 g/dL   RDW 87.1 88.4 - 84.4 %   Platelets 233 150 - 400 K/uL   nRBC 0.4 (H) 0.0 - 0.2 %    Comment: Performed at [facility], [address], [place], [place] [address]  Ethanol     Status: None   Collection Time: 02/19/24  5:51 PM  Result Value Ref Range   Alcohol, Ethyl (B) <15 <15 mg/dL    Comment: (NOTE) For medical purposes only. Performed at [facility], [address] [place], [place] [address]   Protime-INR     Status: Abnormal   Collection Time: 02/19/24  5:51 PM  Result Value Ref Range   Prothrombin Time 18.5 (H) 11.4 - 15.2 seconds   INR 1.5 (H) 0.8 - 1.2    Comment: (NOTE) INR goal varies based on  device and disease states. Performed at [facility], [address], [place], [place] [address]   I-Stat Chem 8, ED     Status: Abnormal   Collection Time: 02/19/24  6:01 PM  Result Value Ref Range   Sodium 137 135 - 145 mmol/L   Potassium 3.7 3.5 - 5.1 mmol/L   Chloride 104 98 - 111 mmol/L   BUN 22 8 - 23 mg/dL   Creatinine, Ser 8.69 (H) 0.61 - 1.24 mg/dL   Glucose, Bld 696 (H) 70 - 99 mg/dL    Comment: Glucose reference range applies only to samples taken after fasting for at least 8 hours.   Calcium, Ion 1.02 (L) 1.15 - 1.40 mmol/L   TCO2 17 (L) 22 - 32 mmol/L   Hemoglobin 15.3 13.0 - 17.0 g/dL   HCT 54.9 60.9 - 47.9 %  I-Stat Lactic Acid, ED     Status: Abnormal   Collection Time: 02/19/24  6:01 PM  Result Value Ref Range   Lactic Acid, Venous 9.3 (HH) 0.5 - 1.9 mmol/L   Comment NOTIFIED PHYSICIAN     DG Abd Portable 1V Result Date: 02/19/2024 EXAM: 1 VIEW XRAY OF THE ABDOMEN 02/19/2024 07:17:16 PM COMPARISON: CT chest abdomen and pelvis 02/19/2011. CLINICAL HISTORY: [id number] Trauma [id number]. OG tube placement. FINDINGS: LINES, TUBES AND DEVICES: Or gastric tube tip is in the gastric fundus. BOWEL: Nonobstructive bowel gas pattern. SOFT TISSUES: No opaque urinary calculi. BONES: No acute osseous abnormality. IMPRESSION: 1. OG tube tip is  in the gastric fundus. Electronically signed by: Greig Pique MD 02/19/2024 07:27 PM EDT RP Workstation: [id number]   CT CHEST ABDOMEN PELVIS W CONTRAST Result Date: 02/19/2024 CLINICAL DATA:  Level 1 trauma.  Post CPR. EXAM: CT CHEST, ABDOMEN, AND PELVIS WITH CONTRAST TECHNIQUE: Multidetector CT imaging of the chest, abdomen and pelvis was performed following the standard protocol during bolus administration of intravenous contrast. RADIATION DOSE REDUCTION: This exam was performed according to the departmental dose-optimization program which includes automated exposure control, adjustment of the mA and/or kV according to patient size and/or use of iterative reconstruction technique. CONTRAST:  75mL OMNIPAQUE IOHEXOL 350 MG/ML SOLN COMPARISON:  None Available. FINDINGS: CT CHEST FINDINGS Motion artifact limitations. Cardiovascular: No evidence of acute aortic injury. No central pulmonary embolus. The heart is enlarged. There are coronary artery calcifications. No pericardial effusion. Mediastinum/Nodes: No mediastinal hemorrhage or hematoma. Small amount of fluid in the esophagus. Small hiatal hernia. No pneumomediastinum. Soft tissue density in the right hilum likely represents lymph nodes. The endotracheal tube tip is above the carina. Lungs/Pleura: Mild emphysema. Dependent opacities within the upper and lower lobes as well as extending along the fissures, query aspiration. More diffuse ground-glass opacities and septal thickening likely pulmonary edema. No pneumothorax. No large pleural effusion. Musculoskeletal: Nondisplaced right anterior fifth rib fracture. Status post median sternotomy without sternal fracture. Mild degenerative change in the thoracic spine, no thoracic spine fracture. The included clavicles and shoulder girdles are intact. No confluent chest wall contusion. CT ABDOMEN PELVIS FINDINGS Hepatobiliary: No hepatic injury or perihepatic hematoma. Subcentimeter hypodensities in the right lobe  of the liver are too small to characterize but likely cysts. Gallbladder is decompressed and not well assessed. Pancreas: No evidence of injury. No ductal dilatation or inflammation. Spleen: No splenic injury or perisplenic hematoma. Adrenals/Urinary Tract: No adrenal hemorrhage or renal injury identified. Left renal atrophy. No hydronephrosis. Bladder is minimally distended, not well assessed. Stomach/Bowel: Questionable area of small bowel wall thickening in the proximal  small bowel/mid abdomen, series 3, image 83, but no associated mesenteric edema or free fluid. Fluid throughout the majority of small bowel. No obstruction. Moderate volume of colonic stool. The colon is redundant. Vascular/Lymphatic: No evidence of vascular injury. Aortic atherosclerosis without aneurysm. No retroperitoneal fluid. No adenopathy. Reproductive: Enlarged prostate spans 5 cm. Other: No free air or free fluid. Small bilateral fat containing inguinal hernias. No confluent body wall contusion. Musculoskeletal: Mild degenerative change in the lumbar spine. No lumbar spine fracture. No pelvic fracture. IMPRESSION: 1. Nondisplaced right anterior fifth rib fracture. No pneumothorax. 2. Dependent opacities within the upper and lower lobes as well as extending along the fissures, query aspiration. More diffuse ground-glass opacities and septal thickening likely pulmonary edema. 3. Questionable area of small bowel wall thickening in the proximal small bowel/mid abdomen, but no associated mesenteric edema or free fluid. Etiology is indeterminate. Consider follow-up exam with enteric contrast should patient have abdominal symptoms. 4. No additional acute traumatic injury to the abdomen or pelvis. 5. Enlarged prostate. Aortic Atherosclerosis (ICD10-I70.0) and Emphysema (ICD10-J43.9). Electronically Signed   By: Andrea Gasman M.D.   On: 02/19/2024 19:01   CT CERVICAL SPINE WO CONTRAST Result Date: 02/19/2024 CLINICAL DATA:  Blunt trauma,  level 1 trauma. EXAM: CT CERVICAL SPINE WITHOUT CONTRAST TECHNIQUE: Multidetector CT imaging of the cervical spine was performed without intravenous contrast. Multiplanar CT image reconstructions were also generated. RADIATION DOSE REDUCTION: This exam was performed according to the departmental dose-optimization program which includes automated exposure control, adjustment of the mA and/or kV according to patient size and/or use of iterative reconstruction technique. COMPARISON:  None Available. FINDINGS: Alignment: Normal. Skull base and vertebrae: Motion artifact limitations. Allowing for this, no acute fracture. The dens is intact. Soft tissues and spinal canal: Pharyngeal fluid surrounding endotracheal tube. No canal hematoma. Disc levels: Degenerative disc disease C4-C5 through C6-C7. Multilevel facet hypertrophy. Upper chest: Assessed on concurrent chest CT, reported separately. Other: Endotracheal tube in place. IMPRESSION: 1. Motion artifact limitations. Allowing for this, no acute fracture or subluxation of the cervical spine. 2. Multilevel degenerative disc disease and facet hypertrophy. Electronically Signed   By: Andrea Gasman M.D.   On: 02/19/2024 18:51   CT HEAD WO CONTRAST Result Date: 02/19/2024 CLINICAL DATA:  Level 1 trauma.  Head trauma. EXAM: CT HEAD WITHOUT CONTRAST TECHNIQUE: Contiguous axial images were obtained from the base of the skull through the vertex without intravenous contrast. RADIATION DOSE REDUCTION: This exam was performed according to the departmental dose-optimization program which includes automated exposure control, adjustment of the mA and/or kV according to patient size and/or use of iterative reconstruction technique. COMPARISON:  Head CT 12/29/2019 FINDINGS: Brain: Moderate motion artifact limitations. High-density along the superior falx consistent with dural calcifications was present on prior exam, some of this appears slightly thicker than prior, however this  is motion obscured. No other intracranial hemorrhage. Cerebellar greater than cerebral atrophy. Allowing for motion, no evidence of cerebral edema. No midline shift or evidence of mass lesion/mass effect. Vascular: Atherosclerosis of skullbase vasculature without hyperdense vessel or abnormal calcification. Skull: No skull fracture, allowing for motion. Sinuses/Orbits: Expansile soft tissue lesion in the left maxillary sinus which erodes the anterior, medial, and posterolateral walls was present on prior exam but increased from prior. This extends to the nasal septum in causes mild rightward nasal septal bowing. There is extension into the ethmoid air cells. Mucosal thickening of left side of sphenoid sinus. No mastoid effusion. Other: None. IMPRESSION: 1. Moderate motion artifact limitations.  2. High-density along the superior falx consistent with dural calcifications was present on prior exam, some of this appears slightly thicker than prior. This is favored to represent dural calcifications exacerbated by motion, however a small amount of subdural hemorrhage is difficult to exclude. Recommend short interval follow-up head CT. 3. Expansile soft tissue lesion in the left maxillary sinus which erodes the anterior, medial, and posterolateral walls was present on prior exam but increased in size from prior. This extends to the nasal septum in causes mild rightward nasal septal bowing. There is extension into the ethmoid air cells. Recommend elective ENT consultation. These results were called by telephone at the time of interpretation on 02/19/2024 at 6:47 pm to provider Dr Teresa, who verbally acknowledged these results. Electronically Signed   By: Andrea Gasman M.D.   On: 02/19/2024 18:47   DG Chest Port 1 View Result Date: 02/19/2024 CLINICAL DATA:  Trauma, post CPR. EXAM: PORTABLE CHEST 1 VIEW COMPARISON:  12/29/2019 FINDINGS: Lung apices are excluded from the field of view. The endotracheal tube tip is 6.4  cm from the carina. Prior median sternotomy. The heart is mildly enlarged but stable. Post CABG. Mild diffuse pulmonary edema. No confluent opacity, large pleural effusion or pneumothorax. No grossly displaced rib fracture. IMPRESSION: 1. Endotracheal tube tip 6.4 cm from the carina. 2. Mild pulmonary edema. Electronically Signed   By: Andrea Gasman M.D.   On: 02/19/2024 18:21   DG Pelvis Portable Result Date: 02/19/2024 CLINICAL DATA:  Status post trauma, CPR. EXAM: PORTABLE PELVIS 1-2 VIEWS COMPARISON:  None Available. FINDINGS: The cortical margins of the bony pelvis are intact. No fracture. Pubic symphysis and sacroiliac joints are congruent. Both femoral heads are well-seated in the respective acetabula. IMPRESSION: No pelvic fracture. Electronically Signed   By: Andrea Gasman M.D.   On: 02/19/2024 18:20      Assessment/Plan: 62yoM s/p post CPR arrest, unwitnessed presumably fall from standing  Equivocal subdural hematoma on CT head - neurosurgery consulted by Dr. Ginger @ 19:08 to clarify and guide as if this were the case, would potentially impact management per cardiology  Nondisplaced right anterior 5th rib fx - suspected 2/2 CPR; conservative management, multimodal pain control  Questionable area of small bowel wall thickening in proximal/mid small bowel - we will follow with you  Incidental finding - Expansile soft tissue lesion in the left maxillary sinus which erodes the anterior, medial, and posterolateral walls was present on prior exam but increased in size from prior - will need outpt follow-up  Dispo- being admitted under CCM, trauma will follow  I spent a total of 80 minutes today in both face-to-face and non-face-to-face activities to perform the following: review records, take and update history, examine the patient, counsel the patient on the diagnosis, and document encounter, findings, and plan in the EHR  for this visit on the date of this  encounter.   Lonni Teresa, MD [facility], [facility]

## 2024-02-19 NOTE — ED Notes (Signed)
 Cardiology at bedside.
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
## 2024-02-20 ENCOUNTER — Inpatient Hospital Stay (HOSPITAL_COMMUNITY): Payer: MEDICAID

## 2024-02-20 DIAGNOSIS — R570 Cardiogenic shock: Secondary | ICD-10-CM

## 2024-02-20 DIAGNOSIS — I5023 Acute on chronic systolic (congestive) heart failure: Secondary | ICD-10-CM

## 2024-02-20 DIAGNOSIS — G931 Anoxic brain damage, not elsewhere classified: Secondary | ICD-10-CM

## 2024-02-20 DIAGNOSIS — I469 Cardiac arrest, cause unspecified: Secondary | ICD-10-CM

## 2024-02-20 DIAGNOSIS — N179 Acute kidney failure, unspecified: Secondary | ICD-10-CM | POA: Diagnosis not present

## 2024-02-20 DIAGNOSIS — J341 Cyst and mucocele of nose and nasal sinus: Secondary | ICD-10-CM

## 2024-02-20 DIAGNOSIS — E8721 Acute metabolic acidosis: Secondary | ICD-10-CM

## 2024-02-20 DIAGNOSIS — G9340 Encephalopathy, unspecified: Secondary | ICD-10-CM | POA: Diagnosis not present

## 2024-02-20 DIAGNOSIS — I4901 Ventricular fibrillation: Principal | ICD-10-CM

## 2024-02-20 DIAGNOSIS — G4089 Other seizures: Secondary | ICD-10-CM

## 2024-02-20 DIAGNOSIS — I213 ST elevation (STEMI) myocardial infarction of unspecified site: Secondary | ICD-10-CM | POA: Diagnosis not present

## 2024-02-20 DIAGNOSIS — J9601 Acute respiratory failure with hypoxia: Secondary | ICD-10-CM | POA: Diagnosis not present

## 2024-02-20 LAB — URINALYSIS, ROUTINE W REFLEX MICROSCOPIC
Bilirubin Urine: NEGATIVE
Glucose, UA: 50 mg/dL — AB
Ketones, ur: NEGATIVE mg/dL
Leukocytes,Ua: NEGATIVE
Nitrite: NEGATIVE
Protein, ur: 100 mg/dL — AB
Specific Gravity, Urine: 1.041 — ABNORMAL HIGH (ref 1.005–1.030)
pH: 5 (ref 5.0–8.0)

## 2024-02-20 LAB — POCT I-STAT 7, (LYTES, BLD GAS, ICA,H+H)
Acid-base deficit: 10 mmol/L — ABNORMAL HIGH (ref 0.0–2.0)
Acid-base deficit: 5 mmol/L — ABNORMAL HIGH (ref 0.0–2.0)
Bicarbonate: 13.9 mmol/L — ABNORMAL LOW (ref 20.0–28.0)
Bicarbonate: 18 mmol/L — ABNORMAL LOW (ref 20.0–28.0)
Calcium, Ion: 1.13 mmol/L — ABNORMAL LOW (ref 1.15–1.40)
Calcium, Ion: 1.02 mmol/L — ABNORMAL LOW (ref 1.15–1.40)
HCT: 53 % — ABNORMAL HIGH (ref 39.0–52.0)
HCT: 51 % (ref 39.0–52.0)
Hemoglobin: 18 g/dL — ABNORMAL HIGH (ref 13.0–17.0)
Hemoglobin: 17.3 g/dL — ABNORMAL HIGH (ref 13.0–17.0)
O2 Saturation: 99 %
O2 Saturation: 99 %
Patient temperature: 96.9
Potassium: 3.9 mmol/L (ref 3.5–5.1)
Potassium: 3.7 mmol/L (ref 3.5–5.1)
Sodium: 133 mmol/L — ABNORMAL LOW (ref 135–145)
Sodium: 138 mmol/L (ref 135–145)
TCO2: 15 mmol/L — ABNORMAL LOW (ref 22–32)
TCO2: 19 mmol/L — ABNORMAL LOW (ref 22–32)
pCO2 arterial: 26.5 mmHg — ABNORMAL LOW (ref 32–48)
pCO2 arterial: 26.3 mmHg — ABNORMAL LOW (ref 32–48)
pH, Arterial: 7.329 — ABNORMAL LOW (ref 7.35–7.45)
pH, Arterial: 7.44 (ref 7.35–7.45)
pO2, Arterial: 129 mmHg — ABNORMAL HIGH (ref 83–108)
pO2, Arterial: 126 mmHg — ABNORMAL HIGH (ref 83–108)

## 2024-02-20 LAB — BASIC METABOLIC PANEL WITH GFR
Anion gap: 15 (ref 5–15)
BUN: 26 mg/dL — ABNORMAL HIGH (ref 8–23)
CO2: 18 mmol/L — ABNORMAL LOW (ref 22–32)
Calcium: 8.1 mg/dL — ABNORMAL LOW (ref 8.9–10.3)
Chloride: 105 mmol/L (ref 98–111)
Creatinine, Ser: 1.65 mg/dL — ABNORMAL HIGH (ref 0.61–1.24)
GFR, Estimated: 46 mL/min — ABNORMAL LOW (ref >60)
Glucose, Bld: 135 mg/dL — ABNORMAL HIGH (ref 70–99)
Potassium: 3.5 mmol/L (ref 3.5–5.1)
Sodium: 138 mmol/L (ref 135–145)

## 2024-02-20 LAB — ECHOCARDIOGRAM COMPLETE
AR max vel: 2.69 cm2
AV Peak grad: 4.6 mmHg
Ao pk vel: 1.07 m/s
Area-P 1/2: 2.87 cm2
Est EF: 15
Height: 74.016 in
S' Lateral: 5.6 cm
Weight: 2719.59 [oz_av]

## 2024-02-20 LAB — HEPARIN LEVEL (UNFRACTIONATED): Heparin Unfractionated: 0.37 [IU]/mL (ref 0.30–0.70)

## 2024-02-20 LAB — GLUCOSE, CAPILLARY
Glucose-Capillary: 133 mg/dL — ABNORMAL HIGH (ref 70–99)
Glucose-Capillary: 142 mg/dL — ABNORMAL HIGH (ref 70–99)
Glucose-Capillary: 149 mg/dL — ABNORMAL HIGH (ref 70–99)
Glucose-Capillary: 155 mg/dL — ABNORMAL HIGH (ref 70–99)
Glucose-Capillary: 161 mg/dL — ABNORMAL HIGH (ref 70–99)
Glucose-Capillary: 168 mg/dL — ABNORMAL HIGH (ref 70–99)
Glucose-Capillary: 182 mg/dL — ABNORMAL HIGH (ref 70–99)
Glucose-Capillary: 186 mg/dL — ABNORMAL HIGH (ref 70–99)

## 2024-02-20 LAB — MAGNESIUM: Magnesium: 2.6 mg/dL — ABNORMAL HIGH (ref 1.7–2.4)

## 2024-02-20 LAB — MRSA NEXT GEN BY PCR, NASAL: MRSA by PCR Next Gen: NOT DETECTED

## 2024-02-20 LAB — RAPID URINE DRUG SCREEN, HOSP PERFORMED
Amphetamines: NOT DETECTED
Barbiturates: NOT DETECTED
Benzodiazepines: NOT DETECTED
Cocaine: NOT DETECTED
Opiates: NOT DETECTED
Tetrahydrocannabinol: NOT DETECTED

## 2024-02-20 LAB — HIV ANTIBODY (ROUTINE TESTING W REFLEX): HIV Screen 4th Generation wRfx: NONREACTIVE

## 2024-02-20 LAB — LACTIC ACID, PLASMA
Lactic Acid, Venous: 3.9 mmol/L (ref 0.5–1.9)
Lactic Acid, Venous: 4.9 mmol/L (ref 0.5–1.9)
Lactic Acid, Venous: 3.7 mmol/L (ref 0.5–1.9)

## 2024-02-20 LAB — PHOSPHORUS: Phosphorus: 3.9 mg/dL (ref 2.5–4.6)

## 2024-02-20 LAB — TROPONIN I (HIGH SENSITIVITY): Troponin I (High Sensitivity): >24000 ng/L (ref <18)

## 2024-02-20 MED ORDER — MIDAZOLAM HCL (PF) 2 MG/2ML IJ SOLN
1.0000 mg | INTRAMUSCULAR | Status: DC | PRN
  Administered 2024-02-23 – 2024-02-24 (×2): 2 mg via INTRAVENOUS
  Filled 2024-02-20 (×2): qty 2

## 2024-02-20 MED ORDER — SODIUM BICARBONATE 8.4 % IV SOLN
100.0000 meq | Freq: Once | INTRAVENOUS | Status: AC
  Administered 2024-02-20: 100 meq via INTRAVENOUS

## 2024-02-20 MED ORDER — HEPARIN (PORCINE) 25000 UT/250ML-% IV SOLN
950.0000 [IU]/h | INTRAVENOUS | Status: DC
  Administered 2024-02-20 – 2024-02-21 (×2): 950 [IU]/h via INTRAVENOUS
  Filled 2024-02-20 (×2): qty 250

## 2024-02-20 MED ORDER — VASOPRESSIN 20 UNITS/100 ML INFUSION FOR SHOCK
0.0000 [IU]/min | INTRAVENOUS | Status: DC
  Administered 2024-02-20 (×2): 0.03 [IU]/min via INTRAVENOUS
  Filled 2024-02-20 (×2): qty 100

## 2024-02-20 MED ORDER — ATORVASTATIN CALCIUM 40 MG PO TABS
40.0000 mg | ORAL_TABLET | Freq: Every day | ORAL | Status: DC
  Administered 2024-02-20 – 2024-02-28 (×9): 40 mg
  Filled 2024-02-20 (×9): qty 1

## 2024-02-20 MED ORDER — ORAL CARE MOUTH RINSE
15.0000 mL | OROMUCOSAL | Status: DC
  Administered 2024-02-20 – 2024-02-29 (×114): 15 mL via OROMUCOSAL

## 2024-02-20 MED ORDER — LEVETIRACETAM (KEPPRA) 500 MG/5 ML ADULT IV PUSH
500.0000 mg | Freq: Two times a day (BID) | INTRAVENOUS | Status: DC
  Administered 2024-02-21 – 2024-02-29 (×17): 500 mg via INTRAVENOUS
  Filled 2024-02-20 (×17): qty 5

## 2024-02-20 MED ORDER — ACETAMINOPHEN 160 MG/5ML PO SOLN
650.0000 mg | ORAL | Status: AC
  Administered 2024-02-20 – 2024-02-21 (×8): 650 mg
  Filled 2024-02-20 (×9): qty 20.3

## 2024-02-20 MED ORDER — SODIUM BICARBONATE 8.4 % IV SOLN
INTRAVENOUS | Status: AC
  Filled 2024-02-20: qty 100

## 2024-02-20 MED ORDER — SODIUM CHLORIDE 0.9 % IV SOLN: 250.0000 mL | INTRAVENOUS | Status: AC

## 2024-02-20 MED ORDER — ACETAMINOPHEN 650 MG RE SUPP: 650.0000 mg | RECTAL | Status: DC | PRN

## 2024-02-20 MED ORDER — NOREPINEPHRINE 4 MG/250ML-% IV SOLN
0.0000 ug/min | INTRAVENOUS | Status: DC
  Administered 2024-02-20: 18 ug/min via INTRAVENOUS
  Administered 2024-02-20 (×4): 17 ug/min via INTRAVENOUS
  Administered 2024-02-21: 8 ug/min via INTRAVENOUS
  Administered 2024-02-21: 4 ug/min via INTRAVENOUS
  Administered 2024-02-22: 8 ug/min via INTRAVENOUS
  Administered 2024-02-23: 1 ug/min via INTRAVENOUS
  Filled 2024-02-20 (×10): qty 250

## 2024-02-20 MED ORDER — LORAZEPAM 2 MG/ML IJ SOLN
4.0000 mg | Freq: Once | INTRAMUSCULAR | Status: AC
Start: 2024-02-20 — End: 2024-02-20
  Administered 2024-02-20: 4 mg via INTRAVENOUS

## 2024-02-20 MED ORDER — DOCUSATE SODIUM 50 MG/5ML PO LIQD
100.0000 mg | Freq: Two times a day (BID) | ORAL | Status: DC
  Administered 2024-02-20 – 2024-02-26 (×11): 100 mg
  Filled 2024-02-20 (×11): qty 10

## 2024-02-20 MED ORDER — SODIUM CHLORIDE 0.9% FLUSH: 10.0000 mL | INTRAVENOUS | Status: DC | PRN

## 2024-02-20 MED ORDER — SODIUM CHLORIDE 0.9% FLUSH
10.0000 mL | Freq: Two times a day (BID) | INTRAVENOUS | Status: DC
  Administered 2024-02-20 – 2024-02-23 (×8): 10 mL
  Administered 2024-02-23 – 2024-02-24 (×2): 30 mL
  Administered 2024-02-24 – 2024-02-27 (×7): 10 mL

## 2024-02-20 MED ORDER — SODIUM BICARBONATE 8.4 % IV SOLN
100.0000 meq | Freq: Once | INTRAVENOUS | Status: AC
  Administered 2024-02-20: 100 meq via INTRAVENOUS
  Filled 2024-02-20: qty 100

## 2024-02-20 MED ORDER — FENTANYL CITRATE (PF) 50 MCG/ML IJ SOSY: 50.0000 ug | PREFILLED_SYRINGE | INTRAMUSCULAR | Status: DC | PRN

## 2024-02-20 MED ORDER — ACETAMINOPHEN 650 MG RE SUPP: 650.0000 mg | RECTAL | Status: AC

## 2024-02-20 MED ORDER — VASOPRESSIN 20 UNITS/100 ML INFUSION FOR SHOCK
0.0400 [IU]/min | INTRAVENOUS | Status: DC
  Filled 2024-02-20: qty 100

## 2024-02-20 MED ORDER — LORAZEPAM 2 MG/ML IJ SOLN
INTRAMUSCULAR | Status: AC
  Filled 2024-02-20: qty 2

## 2024-02-20 MED ORDER — CLOPIDOGREL BISULFATE 75 MG PO TABS
75.0000 mg | ORAL_TABLET | Freq: Every day | ORAL | Status: DC
  Administered 2024-02-20 – 2024-02-28 (×9): 75 mg
  Filled 2024-02-20 (×9): qty 1

## 2024-02-20 MED ORDER — ACETAMINOPHEN 160 MG/5ML PO SOLN
650.0000 mg | ORAL | Status: DC | PRN
  Administered 2024-02-26 – 2024-02-28 (×4): 650 mg
  Filled 2024-02-20 (×8): qty 20.3

## 2024-02-20 MED ORDER — FENTANYL CITRATE (PF) 50 MCG/ML IJ SOSY
50.0000 ug | PREFILLED_SYRINGE | INTRAMUSCULAR | Status: DC | PRN
  Administered 2024-02-20: 200 ug via INTRAVENOUS
  Administered 2024-02-22: 50 ug via INTRAVENOUS
  Administered 2024-02-23: 100 ug via INTRAVENOUS
  Administered 2024-02-23: 50 ug via INTRAVENOUS
  Administered 2024-02-24 (×2): 100 ug via INTRAVENOUS
  Filled 2024-02-20: qty 2
  Filled 2024-02-20: qty 1
  Filled 2024-02-20: qty 4
  Filled 2024-02-20 (×3): qty 2

## 2024-02-20 MED ORDER — LEVETIRACETAM (KEPPRA) 500 MG/5 ML ADULT IV PUSH
20.0000 mg/kg | Freq: Once | INTRAVENOUS | Status: AC
  Administered 2024-02-20: 1500 mg via INTRAVENOUS
  Filled 2024-02-20: qty 15

## 2024-02-20 MED ORDER — CALCIUM GLUCONATE-NACL 2-0.675 GM/100ML-% IV SOLN
2.0000 g | Freq: Once | INTRAVENOUS | Status: AC
  Administered 2024-02-20: 2000 mg via INTRAVENOUS
  Filled 2024-02-20: qty 100

## 2024-02-20 MED ORDER — HEPARIN BOLUS VIA INFUSION
4000.0000 [IU] | Freq: Once | INTRAVENOUS | Status: DC
  Filled 2024-02-20: qty 4000

## 2024-02-20 MED ORDER — ASPIRIN 81 MG PO CHEW
81.0000 mg | CHEWABLE_TABLET | Freq: Every day | ORAL | Status: DC
  Administered 2024-02-20 – 2024-02-28 (×9): 81 mg
  Filled 2024-02-20 (×9): qty 1

## 2024-02-20 MED ORDER — POTASSIUM CHLORIDE 10 MEQ/100ML IV SOLN
10.0000 meq | INTRAVENOUS | Status: AC
  Administered 2024-02-20 (×4): 10 meq via INTRAVENOUS
  Filled 2024-02-20 (×4): qty 100

## 2024-02-20 MED ORDER — POLYETHYLENE GLYCOL 3350 17 G PO PACK
17.0000 g | PACK | Freq: Every day | ORAL | Status: DC
  Administered 2024-02-20 – 2024-02-21 (×2): 17 g
  Filled 2024-02-20 (×4): qty 1

## 2024-02-20 MED ORDER — ACETAMINOPHEN 325 MG PO TABS: 650.0000 mg | ORAL_TABLET | ORAL | Status: DC | PRN

## 2024-02-20 MED ORDER — ACETAMINOPHEN 325 MG PO TABS
650.0000 mg | ORAL_TABLET | ORAL | Status: AC
  Administered 2024-02-20 (×2): 650 mg
  Filled 2024-02-20 (×2): qty 2

## 2024-02-20 MED ORDER — ORAL CARE MOUTH RINSE
15.0000 mL | OROMUCOSAL | Status: DC | PRN
Start: 2024-02-20 — End: 2024-03-01

## 2024-02-20 NOTE — Progress Notes (Signed)
 Progress Note     Subjective: Intubated and sedated. On levo.   Objective: Vital signs in last 24 hours: Temp:  [95.7 F (35.4 C)-100.2 F (37.9 C)] 100.2 F (37.9 C) (10/25 0709) Pulse Rate:  [81-106] 83 (10/25 0700) Resp:  [18-31] 26 (10/25 0700) BP: (80-162)/(64-110) 121/86 (10/25 0700) SpO2:  [97 %-100 %] 99 % (10/25 0700) FiO2 (%):  [60 %-100 %] 60 % (10/25 0527) Weight:  [70 kg-77.1 kg] 77.1 kg (10/25 0709)  Vent Mode: PRVC FiO2 (%):  [60 %-100 %] 60 % Set Rate:  [18 bmp] 18 bmp Vt Set:  [500 mL-650 mL] 650 mL PEEP:  [5 cmH20] 5 cmH20    Intake/Output from previous day: No intake/output data recorded. Intake/Output this shift: No intake/output data recorded.  PE: General: WD, thin male, critically ill  HEENT: ecchymosis of left periorbital area, abrasion to left cheek, staples to right eyebrow, pupils equal and round Heart: regular, rate, and rhythm.  Palpable radial and pedal pulses bilaterally Lungs: ventilated patient  Abd: soft, ND, no grimace on palpation or involuntary guarding MS: all 4 extremities are symmetrical with no cyanosis, clubbing, or edema. Skin: warm and dry with no masses, lesions, or rashes   Lab Results:  Recent Labs    02/19/24 1751 02/19/24 1801 02/19/24 2330 02/20/24 0550  WBC 13.8*  --  22.8*  --   HGB 15.0   < > 17.8* 18.0*  HCT 47.7   < > 54.7* 53.0*  PLT 233  --  264  --    < > = values in this interval not displayed.   BMET Recent Labs    02/19/24 1751 02/19/24 1801 02/19/24 2029 02/19/24 2330 02/20/24 0550  NA 135 137   < > 138 133*  K 3.8 3.7   < > 3.5 3.9  CL 102 104  --  105  --   CO2 14*  --   --  18*  --   GLUCOSE 304* 303*  --  135*  --   BUN 16 22  --  26*  --   CREATININE 1.42* 1.30*  --  1.65*  --   CALCIUM 7.9*  --   --  8.1*  --    < > = values in this interval not displayed.   PT/INR Recent Labs    02/19/24 1751  LABPROT 18.5*  INR 1.5*   CMP     Component Value Date/Time   NA 133  (L) 02/20/2024 0550   K 3.9 02/20/2024 0550   CL 105 02/19/2024 2330   CO2 18 (L) 02/19/2024 2330   GLUCOSE 135 (H) 02/19/2024 2330   BUN 26 (H) 02/19/2024 2330   CREATININE 1.65 (H) 02/19/2024 2330   CALCIUM 8.1 (L) 02/19/2024 2330   PROT 5.6 (L) 02/19/2024 1751   ALBUMIN 3.1 (L) 02/19/2024 1751   AST 80 (H) 02/19/2024 1751   ALT 39 02/19/2024 1751   ALKPHOS 58 02/19/2024 1751   BILITOT 0.9 02/19/2024 1751   GFRNONAA 46 (L) 02/19/2024 2330   GFRAA  02/27/2009 0405    >60        The eGFR has been calculated using the MDRD equation. This calculation has not been validated in all clinical situations. eGFR's persistently <60 mL/min signify possible Chronic Kidney Disease.   Lipase  No results found for: LIPASE     Studies/Results: DG Chest Portable 1 View Result Date: 02/19/2024 EXAM: 1 VIEW(S) XRAY OF THE CHEST 02/19/2024 10:49:13 PM COMPARISON:  None available. CLINICAL HISTORY: Central line placement. Central line placement Central line placement. Central line placement FINDINGS: LINES, TUBES AND DEVICES: Left-sided central venous catheter tip projects over the distal SVC. Endotracheal tube tip is 5 cm above the carina. Enteric tube extends below the diaphragm. LUNGS AND PLEURA: No focal pulmonary opacity. No pulmonary edema. No pleural effusion. No pneumothorax. HEART AND MEDIASTINUM: Patient is status post cardiac surgery. No acute abnormality of the cardiac and mediastinal silhouettes. BONES AND SOFT TISSUES: No acute osseous abnormality. IMPRESSION: 1. Left-sided central venous catheter tip projects over the distal SVC. 2. No evidence for pneumothorax. Electronically signed by: Greig Pique MD 02/19/2024 11:03 PM EDT RP Workstation: [id number]   DG Abd Portable 1V Result Date: 02/19/2024 EXAM: 1 VIEW XRAY OF THE ABDOMEN 02/19/2024 07:17:16 PM COMPARISON: CT chest abdomen and pelvis 02/19/2011. CLINICAL HISTORY: [id number] Trauma [id number]. OG tube placement. FINDINGS: LINES,  TUBES AND DEVICES: Or gastric tube tip is in the gastric fundus. BOWEL: Nonobstructive bowel gas pattern. SOFT TISSUES: No opaque urinary calculi. BONES: No acute osseous abnormality. IMPRESSION: 1. OG tube tip is in the gastric fundus. Electronically signed by: Greig Pique MD 02/19/2024 07:27 PM EDT RP Workstation: [id number]   CT CHEST ABDOMEN PELVIS W CONTRAST Result Date: 02/19/2024 CLINICAL DATA:  Level 1 trauma.  Post CPR. EXAM: CT CHEST, ABDOMEN, AND PELVIS WITH CONTRAST TECHNIQUE: Multidetector CT imaging of the chest, abdomen and pelvis was performed following the standard protocol during bolus administration of intravenous contrast. RADIATION DOSE REDUCTION: This exam was performed according to the departmental dose-optimization program which includes automated exposure control, adjustment of the mA and/or kV according to patient size and/or use of iterative reconstruction technique. CONTRAST:  75mL OMNIPAQUE IOHEXOL 350 MG/ML SOLN COMPARISON:  None Available. FINDINGS: CT CHEST FINDINGS Motion artifact limitations. Cardiovascular: No evidence of acute aortic injury. No central pulmonary embolus. The heart is enlarged. There are coronary artery calcifications. No pericardial effusion. Mediastinum/Nodes: No mediastinal hemorrhage or hematoma. Small amount of fluid in the esophagus. Small hiatal hernia. No pneumomediastinum. Soft tissue density in the right hilum likely represents lymph nodes. The endotracheal tube tip is above the carina. Lungs/Pleura: Mild emphysema. Dependent opacities within the upper and lower lobes as well as extending along the fissures, query aspiration. More diffuse ground-glass opacities and septal thickening likely pulmonary edema. No pneumothorax. No large pleural effusion. Musculoskeletal: Nondisplaced right anterior fifth rib fracture. Status post median sternotomy without sternal fracture. Mild degenerative change in the thoracic spine, no thoracic spine fracture. The  included clavicles and shoulder girdles are intact. No confluent chest wall contusion. CT ABDOMEN PELVIS FINDINGS Hepatobiliary: No hepatic injury or perihepatic hematoma. Subcentimeter hypodensities in the right lobe of the liver are too small to characterize but likely cysts. Gallbladder is decompressed and not well assessed. Pancreas: No evidence of injury. No ductal dilatation or inflammation. Spleen: No splenic injury or perisplenic hematoma. Adrenals/Urinary Tract: No adrenal hemorrhage or renal injury identified. Left renal atrophy. No hydronephrosis. Bladder is minimally distended, not well assessed. Stomach/Bowel: Questionable area of small bowel wall thickening in the proximal small bowel/mid abdomen, series 3, image 83, but no associated mesenteric edema or free fluid. Fluid throughout the majority of small bowel. No obstruction. Moderate volume of colonic stool. The colon is redundant. Vascular/Lymphatic: No evidence of vascular injury. Aortic atherosclerosis without aneurysm. No retroperitoneal fluid. No adenopathy. Reproductive: Enlarged prostate spans 5 cm. Other: No free air or free fluid. Small bilateral fat containing inguinal hernias. No confluent body wall contusion. Musculoskeletal:  Mild degenerative change in the lumbar spine. No lumbar spine fracture. No pelvic fracture. IMPRESSION: 1. Nondisplaced right anterior fifth rib fracture. No pneumothorax. 2. Dependent opacities within the upper and lower lobes as well as extending along the fissures, query aspiration. More diffuse ground-glass opacities and septal thickening likely pulmonary edema. 3. Questionable area of small bowel wall thickening in the proximal small bowel/mid abdomen, but no associated mesenteric edema or free fluid. Etiology is indeterminate. Consider follow-up exam with enteric contrast should patient have abdominal symptoms. 4. No additional acute traumatic injury to the abdomen or pelvis. 5. Enlarged prostate. Aortic  Atherosclerosis (ICD10-I70.0) and Emphysema (ICD10-J43.9). Electronically Signed   By: Andrea Gasman M.D.   On: 02/19/2024 19:01   CT CERVICAL SPINE WO CONTRAST Result Date: 02/19/2024 CLINICAL DATA:  Blunt trauma, level 1 trauma. EXAM: CT CERVICAL SPINE WITHOUT CONTRAST TECHNIQUE: Multidetector CT imaging of the cervical spine was performed without intravenous contrast. Multiplanar CT image reconstructions were also generated. RADIATION DOSE REDUCTION: This exam was performed according to the departmental dose-optimization program which includes automated exposure control, adjustment of the mA and/or kV according to patient size and/or use of iterative reconstruction technique. COMPARISON:  None Available. FINDINGS: Alignment: Normal. Skull base and vertebrae: Motion artifact limitations. Allowing for this, no acute fracture. The dens is intact. Soft tissues and spinal canal: Pharyngeal fluid surrounding endotracheal tube. No canal hematoma. Disc levels: Degenerative disc disease C4-C5 through C6-C7. Multilevel facet hypertrophy. Upper chest: Assessed on concurrent chest CT, reported separately. Other: Endotracheal tube in place. IMPRESSION: 1. Motion artifact limitations. Allowing for this, no acute fracture or subluxation of the cervical spine. 2. Multilevel degenerative disc disease and facet hypertrophy. Electronically Signed   By: Andrea Gasman M.D.   On: 02/19/2024 18:51   CT HEAD WO CONTRAST Result Date: 02/19/2024 CLINICAL DATA:  Level 1 trauma.  Head trauma. EXAM: CT HEAD WITHOUT CONTRAST TECHNIQUE: Contiguous axial images were obtained from the base of the skull through the vertex without intravenous contrast. RADIATION DOSE REDUCTION: This exam was performed according to the departmental dose-optimization program which includes automated exposure control, adjustment of the mA and/or kV according to patient size and/or use of iterative reconstruction technique. COMPARISON:  Head CT  12/29/2019 FINDINGS: Brain: Moderate motion artifact limitations. High-density along the superior falx consistent with dural calcifications was present on prior exam, some of this appears slightly thicker than prior, however this is motion obscured. No other intracranial hemorrhage. Cerebellar greater than cerebral atrophy. Allowing for motion, no evidence of cerebral edema. No midline shift or evidence of mass lesion/mass effect. Vascular: Atherosclerosis of skullbase vasculature without hyperdense vessel or abnormal calcification. Skull: No skull fracture, allowing for motion. Sinuses/Orbits: Expansile soft tissue lesion in the left maxillary sinus which erodes the anterior, medial, and posterolateral walls was present on prior exam but increased from prior. This extends to the nasal septum in causes mild rightward nasal septal bowing. There is extension into the ethmoid air cells. Mucosal thickening of left side of sphenoid sinus. No mastoid effusion. Other: None. IMPRESSION: 1. Moderate motion artifact limitations. 2. High-density along the superior falx consistent with dural calcifications was present on prior exam, some of this appears slightly thicker than prior. This is favored to represent dural calcifications exacerbated by motion, however a small amount of subdural hemorrhage is difficult to exclude. Recommend short interval follow-up head CT. 3. Expansile soft tissue lesion in the left maxillary sinus which erodes the anterior, medial, and posterolateral walls was present on prior exam  but increased in size from prior. This extends to the nasal septum in causes mild rightward nasal septal bowing. There is extension into the ethmoid air cells. Recommend elective ENT consultation. These results were called by telephone at the time of interpretation on 02/19/2024 at 6:47 pm to provider Dr Teresa, who verbally acknowledged these results. Electronically Signed   By: Andrea Gasman M.D.   On: 02/19/2024 18:47    DG Chest Port 1 View Result Date: 02/19/2024 CLINICAL DATA:  Trauma, post CPR. EXAM: PORTABLE CHEST 1 VIEW COMPARISON:  12/29/2019 FINDINGS: Lung apices are excluded from the field of view. The endotracheal tube tip is 6.4 cm from the carina. Prior median sternotomy. The heart is mildly enlarged but stable. Post CABG. Mild diffuse pulmonary edema. No confluent opacity, large pleural effusion or pneumothorax. No grossly displaced rib fracture. IMPRESSION: 1. Endotracheal tube tip 6.4 cm from the carina. 2. Mild pulmonary edema. Electronically Signed   By: Andrea Gasman M.D.   On: 02/19/2024 18:21   DG Pelvis Portable Result Date: 02/19/2024 CLINICAL DATA:  Status post trauma, CPR. EXAM: PORTABLE PELVIS 1-2 VIEWS COMPARISON:  None Available. FINDINGS: The cortical margins of the bony pelvis are intact. No fracture. Pubic symphysis and sacroiliac joints are congruent. Both femoral heads are well-seated in the respective acetabula. IMPRESSION: No pelvic fracture. Electronically Signed   By: Andrea Gasman M.D.   On: 02/19/2024 18:20    Anti-infectives: Anti-infectives (From admission, onward)    None        Assessment/Plan  Found down S/p CPR arrest - appears to have had STEMI, cards following  R 5th rib fracture - presumably from CPR, multimodal pain control, pulm toilet  ?SDH - question possible calcification rather than SDH, repeat CTH ordered by primary team, if more concerning for SDH recommend NS consult  ?small bowel wall thickening - can repeat CT AP at some point if pt able to report abdominal pain or clinical signs concerning for intraabdominal pathology, recommend PPI Facial laceration - closed with staples 10/24, recommend removal 10/29  No other recommendations from trauma surgery standpoint, we will sign off at this time. Please call if we can be of assistance.     LOS: 1 day   I reviewed Consultant cardiology notes, last 24 h vitals and pain scores, last 48 h intake  and output, last 24 h labs and trends, last 24 h imaging results, and PCCM notes.  This care required moderate level of medical decision making.    Burnard JONELLE Louder, [facility] 02/20/2024, 8:26 AM Please see Amion for pager number during day hours 7:00am-4:30pm

## 2024-02-20 NOTE — Consult Note (Addendum)
 NEUROLOGY CONSULT NOTE   Date of service: February 20, 2024 Patient Name: [name] MRN:  [id number] DOB:  20-Jan-1961 Chief Complaint: cardiac arrest Requesting Provider: Harold Scholz, MD  History of Present Illness  AHMON TOSI is a 63 y.o. male with hx of CAD status post PCI 2010 for anterior STEMI with subsequent CABG x 3, ischemic cardiomyopathy (EF 25 to 30%), CHF R EF who was found down 10/25 at outside women[facility] and [facility] with unknown downtime.  CPR initiated 1655, noted to be in V-fib arrest.  Per EMS, shocked approximately 12 times.  An IO was placed: 450 mg amiodarone, 6 mg epi, 1 L IVF, 2 g mag given.  In ED, Levophed drip at 15 mics, amiodarone drip, intubated and sedated.  Code STEMI called due to ST elevations in anterior lateral leads.  However there was a potential ICH from trauma, patient not taken to Cath Lab given need for heparin during cath procedure.  Repeat head CT this morning showed chronic but progressed subdural hygromas, no acute ICH or subdural blood. Heparin IV started today.   Due to unknown downtime, cardiac arrest there is concern for myoclonus. HE was noted to have brief episodes of jerking concerning for myoclonic seizures. LTM EEG initially with myoclonic seizures. Neurology consulted for evaluation.  ROS   Unable to ascertain due to intubation, sedation, mental status.   Past History   Past Medical History:  Diagnosis Date   Heart disease     Past Surgical History:  Procedure Laterality Date   CORONARY STENT PLACEMENT      Family History: No family history on file.  Social History  reports that he has been smoking. He does not have any smokeless tobacco history on file. He reports current drug use. He reports that he does not drink alcohol.  No Known Allergies  Medications   Current Facility-Administered Medications:    0.9 %  sodium chloride infusion, 250 mL, Intravenous, Continuous, Harold Scholz, MD, Held at  02/20/24 1027   acetaminophen  (TYLENOL ) tablet 650 mg, 650 mg, Per Tube, Q4H, 650 mg at 02/20/24 1103 **OR** acetaminophen  (TYLENOL ) 160 MG/5ML solution 650 mg, 650 mg, Per Tube, Q4H **OR** acetaminophen  (TYLENOL ) suppository 650 mg, 650 mg, Rectal, Q4H, Chand, Sudham, MD   [START ON 02/22/2024] acetaminophen  (TYLENOL ) tablet 650 mg, 650 mg, Per Tube, Q4H PRN **OR** [START ON 02/22/2024] acetaminophen  (TYLENOL ) 160 MG/5ML solution 650 mg, 650 mg, Per Tube, Q4H PRN **OR** [START ON 02/22/2024] acetaminophen  (TYLENOL ) suppository 650 mg, 650 mg, Rectal, Q4H PRN, Harold, Sudham, MD   amiodarone (NEXTERONE PREMIX) 360-4.14 MG/200ML-% (1.8 mg/mL) IV infusion, 30 mg/hr, Intravenous, Continuous, Tegeler, Lonni PARAS, MD, Last Rate: 16.67 mL/hr at 02/20/24 1400, 30 mg/hr at 02/20/24 1400   aspirin chewable tablet 81 mg, 81 mg, Per Tube, Daily, Chand, Sudham, MD, 81 mg at 02/20/24 1134   atorvastatin (LIPITOR) tablet 40 mg, 40 mg, Per Tube, Daily, O'Neal, Darryle Ned, MD, 40 mg at 02/20/24 9043   Chlorhexidine Gluconate Cloth 2 % PADS 6 each, 6 each, Topical, Daily, Maree Harder, MD, 6 each at 02/20/24 0935   clopidogrel (PLAVIX) tablet 75 mg, 75 mg, Per Tube, Daily, Harold Scholz, MD, 75 mg at 02/20/24 1134   docusate (COLACE) 50 MG/5ML liquid 100 mg, 100 mg, Per Tube, BID PRN, Maree Harder, MD   docusate (COLACE) 50 MG/5ML liquid 100 mg, 100 mg, Per Tube, BID, Paliwal, Aditya, MD, 100 mg at 02/20/24 0956   fentaNYL (SUBLIMAZE) injection 50-200 mcg,  50-200 mcg, Intravenous, Q30 min PRN, Paliwal, Aditya, MD, 200 mcg at 02/20/24 0153   heparin ADULT infusion 100 units/mL (25000 units/250mL), 950 Units/hr, Intravenous, Continuous, Denninger, Jade M, RPH, Last Rate: 9.5 mL/hr at 02/20/24 1400, 950 Units/hr at 02/20/24 1400   insulin aspart (novoLOG) injection 0-9 Units, 0-9 Units, Subcutaneous, Q4H, Maree Harder, MD, 1 Units at 02/20/24 1257   midazolam PF (VERSED) injection 1-2 mg, 1-2 mg, Intravenous, Q1H PRN,  Paliwal, Aditya, MD   norepinephrine (LEVOPHED) 4mg  in (0.016 mg/mL) premix infusion, 0-40 mcg/min, Intravenous, Titrated, Paliwal, Aditya, MD, Last Rate: 63.8 mL/hr at 02/20/24 1408, 17 mcg/min at 02/20/24 1408   ondansetron (ZOFRAN) injection 4 mg, 4 mg, Intravenous, Q6H PRN, Maree Harder, MD   Oral care mouth rinse, 15 mL, Mouth Rinse, Q2H, Maree Harder, MD, 15 mL at 02/20/24 1159   Oral care mouth rinse, 15 mL, Mouth Rinse, PRN, Maree Harder, MD   pantoprazole (PROTONIX) injection 40 mg, 40 mg, Intravenous, QHS, Maree Harder, MD, 40 mg at 02/20/2024 2336   polyethylene glycol (MIRALAX / GLYCOLAX) packet 17 g, 17 g, Per Tube, Daily PRN, Maree Harder, MD   polyethylene glycol (MIRALAX / GLYCOLAX) packet 17 g, 17 g, Per Tube, Daily, Paliwal, Aditya, MD, 17 g at 02/20/24 0956   propofol (DIPRIVAN) 1000 MG/100ML infusion, 0-80 mcg/kg/min, Intravenous, Continuous, Chand, Valinda, MD, Last Rate: 21 mL/hr at 02/20/24 1400, 50 mcg/kg/min at 02/20/24 1400   sodium chloride flush (NS) 0.9 % injection 10-40 mL, 10-40 mL, Intracatheter, Q12H, Maree Harder, MD, 10 mL at 02/20/24 0957   sodium chloride flush (NS) 0.9 % injection 10-40 mL, 10-40 mL, Intracatheter, PRN, Maree Harder, MD  Vitals   Vitals:   02/20/24 1325 02/20/24 1330 02/20/24 1345 02/20/24 1400  BP: (!) 85/70 (!) 84/68 (!) 86/71 (!) 86/71  Pulse:      Resp: 19 20 (!) 21 20  Temp:  97.9 F (36.6 C)  (!) 97.3 F (36.3 C)  TempSrc:      SpO2:      Weight:      Height:        Body mass index is 21.81 kg/m.   Physical Exam   Constitutional: Critically ill Cardiovascular: SR. Requiring use of Pressors at high dose.  Respiratory: Mechanically ventilated on full support  Neurologic Examination performed on sedation with propofol at 50   Neuro: Mental Status: Patient is sedated.  Patient does not open eyes to voice or noxious stimuli.  Patient does not follow commands.    Cranial Nerves: With passive eye raise pupils are  reactive. No corneal reflexes.Patient does not track examiner, EOMI not attempted due to pt being found down. ET tube in place, face seems subjectively symmetric with facial diplegia and tongue appears midline.  No cough or gag reflex.  Motor/Sensory: No spontaneous movement or withdrawal to pain in any extremity.  Labs/Imaging/Neurodiagnostic studies   CBC:  Recent Labs  Lab 02-20-24 1751 2024/02/20 1801 02-20-24 2330 02/20/24 0550 02/20/24 0852  WBC 13.8*  --  22.8*  --   --   HGB 15.0   < > 17.8* 18.0* 17.3*  HCT 47.7   < > 54.7* 53.0* 51.0  MCV 97.5  --  92.1  --   --   PLT 233  --  264  --   --    < > = values in this interval not displayed.   Basic Metabolic Panel:  Lab Results  Component Value Date   NA 138 02/20/2024  K 3.7 02/20/2024   CO2 18 (L) 02/19/2024   GLUCOSE 135 (H) 02/19/2024   BUN 26 (H) 02/19/2024   CREATININE 1.65 (H) 02/19/2024   CALCIUM 8.1 (L) 02/19/2024   GFRNONAA 46 (L) 02/19/2024   GFRAA  02/27/2009    >60        The eGFR has been calculated using the MDRD equation. This calculation has not been validated in all clinical situations. eGFR's persistently <60 mL/min signify possible Chronic Kidney Disease.   Lipid Panel:  Lab Results  Component Value Date   LDLCALC 58 04/05/2009   HgbA1c:  Lab Results  Component Value Date   HGBA1C 5.1 02/19/2024   Urine Drug Screen:     Component Value Date/Time   LABOPIA NONE DETECTED 02/20/2024 0430   COCAINSCRNUR NONE DETECTED 02/20/2024 0430   LABBENZ NONE DETECTED 02/20/2024 0430   AMPHETMU NONE DETECTED 02/20/2024 0430   THCU NONE DETECTED 02/20/2024 0430   LABBARB NONE DETECTED 02/20/2024 0430    Alcohol Level     Component Value Date/Time   ETH <15 02/19/2024 1751   INR  Lab Results  Component Value Date   INR 1.5 (H) 02/19/2024   APTT  Lab Results  Component Value Date   APTT (H) 02/23/2009    40        IF BASELINE aPTT IS ELEVATED, SUGGEST PATIENT RISK ASSESSMENT BE  USED TO DETERMINE APPROPRIATE ANTICOAGULANT THERAPY.   AED levels: No results found for: PHENYTOIN, ZONISAMIDE, LAMOTRIGINE, LEVETIRACETA  Repeat CT Head without contrast(Personally reviewed): Chronic but progressed, symmetric posterior fossa subdural hygromas - increased since 2021 (now up to 9 mm) with mass effect on both cerebellar hemispheres. But no cerebellar edema. And Etiology and significance of this finding is unclear. Recommend follow up with Neurosurgery. No acute intracranial hemorrhage or subdural blood. No acute intracranial abnormality. Severe chronic paranasal sinus disease.  CT Head without contrast (Personally reviewed): Moderate motion artifact limitations. High-density along the superior falx consistent with dural calcifications was present on prior exam, some of this appears slightly thicker than prior. This is favored to represent dural calcifications exacerbated by motion, however a small amount of subdural hemorrhage is difficult to exclude.  Expansile soft tissue lesion in the left maxillary sinus which erodes the anterior, medial, and posterolateral walls was present on prior exam but increased in size from prior.   Neurodiagnostics LTM EEG:  Pending read 10/26 AM  ASSESSMENT   DELORIS MITTAG is a 63 y.o. male with hx of CAD status post PCI 2010 for anterior STEMI with subsequent CABG x 3, ischemic cardiomyopathy (EF 25 to 30%), CHF R EF who was found down 10/25 with unknown downtime.  CPR initiated 1655, noted to be in V-fib arrest.  Per EMS, shocked approximately 12 times.    On exam, patient is comatose, absent corneal cough and gag reflexes.  Pupils are reactive.  No reaction to noxious stimuli in any extremity.  EEG reviewed in room, appears flat.  Pending official read in AM.  Impression: Anoxic Brain Injury secondary to Cardiac Arrest with unknown downtime Myoclonic seizures  RECOMMENDATIONS   - Continue LTM EEG - Keppra load 20mg /kg x1 -  start maintenance Keppra 500mg  BID - next step if he continues to have seizures, will be to use Valproic acid - ok with Propofol as sedation - MRI in 3 days from cardiac arrest to evaluate ABI further ______________________________________________________________________    Signed, Rocky JAYSON Likes, NP [facility]   NEUROHOSPITALIST ADDENDUM Performed  a face to face diagnostic evaluation.   I have reviewed the contents of history and physical exam as documented by PA/ARNP/Resident and agree with above documentation.  I have discussed and formulated the above plan as documented. Edits to the note have been made as needed.  Jaeleah Smyser, MD [facility] [phone]   If 7pm to 7am, please call on call as listed on AMION.

## 2024-02-20 NOTE — Plan of Care (Signed)
  Problem: Education: Goal: Ability to describe self-care measures that may prevent or decrease complications (Diabetes Survival Skills Education) will improve Outcome: Not Progressing Goal: Individualized Educational Video(s) Outcome: Not Progressing   Problem: Fluid Volume: Goal: Ability to maintain a balanced intake and output will improve Outcome: Not Progressing

## 2024-02-20 NOTE — Progress Notes (Signed)
Pt transported to /from CT scan via vent w/ no apparent complications.   

## 2024-02-20 NOTE — Progress Notes (Signed)
 ABG was collected and results are in lab review. MD and nurse notified.

## 2024-02-20 NOTE — Progress Notes (Signed)
 Echocardiogram 2D Echocardiogram has been performed.  [name] 02/20/2024, 1:56 PM

## 2024-02-20 NOTE — Progress Notes (Signed)
 LTM EEG hooked up and running - no initial skin breakdown - push button tested - [facility] monitoring. MRI Compatible leads used.

## 2024-02-20 NOTE — Progress Notes (Signed)
 PHARMACY - ANTICOAGULATION CONSULT NOTE  Pharmacy Consult for Heparin  Indication: chest pain/ACS  No Known Allergies  Patient Measurements: Height: 6' 2.02 (188 cm) Weight: 77.1 kg (169 lb 15.6 oz) IBW/kg (Calculated) : 82.24 HEPARIN DW (KG): 77.1  Vital Signs: Temp: 100.6 F (38.1 C) (10/25 1100) Temp Source: Bladder (10/25 0709) BP: 108/81 (10/25 1100) Pulse Rate: 99 (10/25 1103)  Labs: Recent Labs    02/19/24 1751 02/19/24 1801 02/19/24 2018 02/19/24 2029 02/19/24 2133 02/19/24 2330 02/20/24 0550 02/20/24 0852  HGB 15.0 15.3  --    < >  --  17.8* 18.0* 17.3*  HCT 47.7 45.0  --    < >  --  54.7* 53.0* 51.0  PLT 233  --   --   --   --  264  --   --   LABPROT 18.5*  --   --   --   --   --   --   --   INR 1.5*  --   --   --   --   --   --   --   CREATININE 1.42* 1.30*  --   --   --  1.65*  --   --   TROPONINIHS 173*  --  5,970*  --  15,390* >24,000*  --   --    < > = values in this interval not displayed.    Estimated Creatinine Clearance: 50 mL/min (A) (by C-G formula based on SCr of 1.65 mg/dL (H)).   Medical History: Past Medical History:  Diagnosis Date   Heart disease     Medications:  Infusions:   sodium chloride 10 mL/hr at 02/20/24 0800   sodium chloride Stopped (02/20/24 1027)   amiodarone 30 mg/hr (02/20/24 0800)   heparin     norepinephrine (LEVOPHED) Adult infusion 14 mcg/min (02/20/24 0800)   potassium chloride 10 mEq (02/20/24 1029)   propofol (DIPRIVAN) infusion 20 mcg/kg/min (02/20/24 0800)   vasopressin Stopped (02/20/24 0448)    Assessment: Patient is a 63 year old male with an extensive cardiac history who was found down in Vfib arrest due to proposed STEMI, complicated by seizures. Heparin was not started due to concern originally for head bleed, but ICH was ruled out on CT 10/25. Patient will not go to cath per cardiology. Pharmacy is consulted for IV heparin for 48 hours for ACS.  Troponin still increasing, last level  >24k.  Goal of Therapy:  Heparin level 0.3-0.7 units/ml Monitor platelets by anticoagulation protocol: Yes   Plan:  Start heparin infusion at 950 units/hr No bolus per CCM  Continue for 48h until 10/27 ~1200.  Heparin level in 8 hours  Daily HL and CBC   Nyle Limb M Razi Hickle 02/20/2024,11:10 AM

## 2024-02-20 NOTE — Progress Notes (Addendum)
 NAME:  [name], MRN:  [id number], DOB:  [birth year], LOS: 1 ADMISSION DATE:  02/19/2024, CONSULTATION DATE:  02/19/2024 REFERRING MD:  Lonni Sakai, MD, CHIEF COMPLAINT:  Found Down Outside Hospital   History of Present Illness:  63 y/o male with PMH for CAD who had a PCI in 2010 for anterior STEMI and found to have 3 vessel disease and went for CABG in 2010 (LIMA-LAD, SVG-Ramus, SVG -OM, SVG-RPDA), HFrEF, Ischemic Cardiomyopathy-last echo 2011 showing EF 25-30%, with akinesis to dyskinesis of the entire anterior, anteroseptal, and apical myocardium, and cocaine use disorder who was found down outside [facility] with unknown down time and CPR initiated 1655.  Patient noted to be in V fib arrest.  He was shocked approximately 12 times by EMS, IO placed and 450mg  Amiodarone, 6mg  Epinephrine, 1 liter IVF, 2gm Magnesium.  In ED patient on Levophed drip at 15mcg and Amiodarone drip.  He was intubated and sedated.  Code STEMI called, ST elevations in anteriolat leads and I spoke with Dr End, there is a potential ICH and so there is hesitation to take patient to cath lab given Heparin during cath. LA 9.3, Cr 1.30, WBC 13.8, INR 1.5, Glucose 303.  CT head also showing a soft tissue mass in the left maxillary sinus. Pertinent  Medical History  CAD who had a PCI in 2010 for anterior STEMI and found to have 3 vessel disease and went for CABG in 2010 (LIMA-LAD, SVG-Ramus, SVG -OM, SVG-RPDA), HFrEF, Ischemic Cardiomyopahty-last echo 2011 showing EF 25-30%, with akinesis to dyskinesis of the entire anterior, anteroseptal, and apical myocardium, and cocaine use disorder   Significant Hospital Events: Including procedures, antibiotic start and stop dates in addition to other pertinent events   10/24: Admit to ICU  Interim History / Subjective:  Patient is on 15 mics of Levophed Remained afebrile Serum troponin continue to trend up, currently at >24k  Objective     Blood pressure (!) 122/99, pulse 83, temperature (!) 96.9 F (36.1 C), temperature source Axillary, resp. rate (!) 25, height 6' 2 (1.88 m), weight 70 kg, SpO2 100%.    Vent Mode: PRVC FiO2 (%):  [60 %-100 %] 60 % Set Rate:  [18 bmp] 18 bmp Vt Set:  [500 mL-650 mL] 650 mL PEEP:  [5 cmH20] 5 cmH20  No intake or output data in the 24 hours ending 02/20/24 0734 Filed Weights   02/19/24 1930  Weight: 70 kg    Examination: General: Crtitically ill-appearing male, orally intubated HEENT: Right forehead trauma noted, with staples in place over the right eyebrow, eyes anicteric.  ETT and OGT in place Neuro: Eyes closed, does not open, not following commands, pupils pinpoint, reactive to light, absent cough and gag, triggering ventilator breathing Chest: Coarse breath sounds, no wheezes or rhonchi Heart: Regular rate and rhythm, no murmurs or gallops Abdomen: Soft, nondistended, bowel sounds present  Labs and images reviewed  Patient Lines/Drains/Airways Status     Active Line/Drains/Airways     Name Placement date Placement time Site Days   Peripheral IV 02/19/24 Anterior;Distal;Left;Upper Arm 02/19/24  1832  Arm  1   Peripheral IV 02/19/24 Anterior;Left Forearm 02/19/24  --  Forearm  1   CVC Triple Lumen 02/19/24 Left Internal jugular 02/19/24  2241  -- 1   NG/OG Vented/Dual Lumen 16 Fr. Oral Marking at nare/corner of mouth 65 cm 02/19/24  1847  Oral  1   Urethral Catheter Thedora Ward, RN Temperature probe 16  Fr. 02/19/24  2053  Temperature probe  1   Airway 7 mm 02/19/24  1745  -- 1   Intraosseous Line 02/19/24 02/19/24  --  --  1        Resolved problem list   Assessment and Plan  Acute inferior lateral STEMI Status post out-of-hospital V-fib cardiac arrest Acute respiratory failure with hypoxia Acute encephalopathy, likely anoxic Acute kidney injury due to ischemic ATN postcardiac arrest Acute high anion gap metabolic acidosis/lactic acidosis Acute on chronic HFrEF due  to ischemic cardiomyopathy with cardiogenic shock Question of small subdural hemorrhage, likely calcification Left maxillary sinus mass  Patient was not taken to cardiac Cath Lab in the setting of questionable small subdural hemorrhage, it is likely calcification He is not on aspirin and Plavix Will repeat head CT to rule out subdural hemorrhage before starting aspirin and IV heparin infusion Continue amiodarone Remain in sinus rhythm Continue lung protective ventilation VAP prevention bundle in place PAD protocol with propofol and fentanyl Will get EEG to rule out myoclonus Serum creatinine trended up, closely monitor Avoid nephrotoxic agent Repeat lactate and ABGs Patient's EF was low in 2011, repeat echocardiogram pending Patient will need ENT consultation as an outpatient once clinically stable  Labs   CBC: Recent Labs  Lab 02/19/24 1751 02/19/24 1801 02/19/24 2029 02/19/24 2330 02/20/24 0550  WBC 13.8*  --   --  22.8*  --   HGB 15.0 15.3 18.4* 17.8* 18.0*  HCT 47.7 45.0 54.0* 54.7* 53.0*  MCV 97.5  --   --  92.1  --   PLT 233  --   --  264  --     Basic Metabolic Panel: Recent Labs  Lab 02/19/24 1751 02/19/24 1801 02/19/24 2029 02/19/24 2330 02/20/24 0550  NA 135 137 134* 138 133*  K 3.8 3.7 3.8 3.5 3.9  CL 102 104  --  105  --   CO2 14*  --   --  18*  --   GLUCOSE 304* 303*  --  135*  --   BUN 16 22  --  26*  --   CREATININE 1.42* 1.30*  --  1.65*  --   CALCIUM 7.9*  --   --  8.1*  --   MG  --   --   --  2.6*  --   PHOS  --   --   --  3.9  --    GFR: Estimated Creatinine Clearance: 45.4 mL/min (A) (by C-G formula based on SCr of 1.65 mg/dL (H)). Recent Labs  Lab 02/19/24 1751 02/19/24 1801 02/19/24 2330  WBC 13.8*  --  22.8*  LATICACIDVEN  --  9.3*  --     Liver Function Tests: Recent Labs  Lab 02/19/24 1751  AST 80*  ALT 39  ALKPHOS 58  BILITOT 0.9  PROT 5.6*  ALBUMIN 3.1*   No results for input(s): LIPASE, AMYLASE in the last 168  hours. No results for input(s): AMMONIA in the last 168 hours.  ABG    Component Value Date/Time   PHART 7.329 (L) 02/20/2024 0550   PCO2ART 26.5 (L) 02/20/2024 0550   PO2ART 129 (H) 02/20/2024 0550   HCO3 13.9 (L) 02/20/2024 0550   TCO2 15 (L) 02/20/2024 0550   ACIDBASEDEF 10.0 (H) 02/20/2024 0550   O2SAT 99 02/20/2024 0550     Coagulation Profile: Recent Labs  Lab 02/19/24 1751  INR 1.5*    Cardiac Enzymes: No results for input(s): CKTOTAL, CKMB, CKMBINDEX, TROPONINI  in the last 168 hours.  HbA1C: Hgb A1c MFr Bld  Date/Time Value Ref Range Status  02/19/2024 11:30 PM 5.1 4.8 - 5.6 % Final    Comment:    (NOTE) Diagnosis of Diabetes The following HbA1c ranges recommended by the American Diabetes Association (ADA) may be used as an aid in the diagnosis of diabetes mellitus.  Hemoglobin             Suggested A1C NGSP%              Diagnosis  <5.7                   Non Diabetic  5.7-6.4                Pre-Diabetic  >6.4                   Diabetic  <7.0                   Glycemic control for                       adults with diabetes.    02/22/2009 04:00 AM  4.6 - 6.1 % Final   5.3 (NOTE) The ADA recommends the following therapeutic goal for glycemic control related to Hgb A1c measurement: Goal of therapy: <6.5 Hgb A1c  Reference: American Diabetes Association: Clinical Practice Recommendations 2010, Diabetes Care, 2010, 33: (Suppl  1).    CBG: Recent Labs  Lab 02/19/24 2338 02/20/24 0021 02/20/24 0314 02/20/24 0425  GLUCAP 155* 142* 186* 182*    The patient is critically ill due to acute STEMI/V-fib cardiac arrest/shock requiring titration of vasopressors.  Critical care was necessary to treat or prevent imminent or life-threatening deterioration.  Critical care was time spent personally by me on the following activities: development of treatment plan with patient and/or surrogate as well as nursing, discussions with consultants, evaluation of  patient's response to treatment, examination of patient, obtaining history from patient or surrogate, ordering and performing treatments and interventions, ordering and review of laboratory studies, ordering and review of radiographic studies, pulse oximetry, re-evaluation of patient's condition and participation in multidisciplinary rounds.   During this encounter critical care time was devoted to patient care services described in this note for 44 minutes.     Valinda Novas, MD [facility] See Amion for pager If no response to pager, please call [phone] until 7pm After 7pm, Please call E-link [phone]

## 2024-02-20 NOTE — Progress Notes (Signed)
 RT transported patient on the ventilator from ED-TRACC to [id number]-09. RN and NT were also present during the transport. Patients vitals remained stable throughout the trip. RT report was given prior to arrival to the unit.

## 2024-02-20 NOTE — Progress Notes (Addendum)
 eLink Physician-Brief Progress Note Patient Name: [name] DOB: October 12, 1960 MRN: [id number]   Date of Service  02/20/2024  HPI/Events of Note  63 year old male with a history of coronary artery disease status post PCI and 3V CABG in 2010 complicated by HFrEF who presents with out-of-hospital V-fib arrest complicated by respiratory failure and multiorgan dysfunction with potential ICH.  Vital signs are within normal limits saturating 100% on 100% FiO2.  Currently running amiodarone and propofol infusions.  Results consistent with non-anion gap metabolic acidosis, elevated creatinine, mild electrolyte disturbances, troponin greater than 24,000, leukocytosis, polycythemia, and hyperglycemia.  CT imaging reviewed.  eICU Interventions  Add PAD protocol with as needed Versed/fentanyl.  Noticeable myoclonic jerking, no need to treat this  Maintain normothermia  Cardiology following, ST elevations on monitor likely reflective of ongoing myocardial injury, maintain norepinephrine and amiodarone  DVT prophylaxis with SCDs given concern for ICH GI prophylaxis with pantoprazole   0258 -switch to central dosing for norepinephrine.  Initiate vasopressin.  Intervention Category Evaluation Type: New Patient Evaluation  Sigrid Schwebach 02/20/2024, 12:49 AM

## 2024-02-20 NOTE — Progress Notes (Signed)
 PHARMACY - ANTICOAGULATION CONSULT NOTE  Pharmacy Consult for Heparin  Indication: chest pain/ACS  No Known Allergies  Patient Measurements: Height: 6' 2.02 (188 cm) Weight: 77.1 kg (169 lb 15.6 oz) IBW/kg (Calculated) : 82.24 HEPARIN DW (KG): 77.1  Vital Signs: Temp: 98.6 F (37 C) (10/25 1800) BP: 115/83 (10/25 2000) Pulse Rate: 75 (10/25 1800)  Labs: Recent Labs    02/19/24 1751 02/19/24 1801 02/19/24 2018 02/19/24 2029 02/19/24 2133 02/19/24 2330 02/20/24 0550 02/20/24 0852 02/20/24 1955  HGB 15.0 15.3  --    < >  --  17.8* 18.0* 17.3*  --   HCT 47.7 45.0  --    < >  --  54.7* 53.0* 51.0  --   PLT 233  --   --   --   --  264  --   --   --   LABPROT 18.5*  --   --   --   --   --   --   --   --   INR 1.5*  --   --   --   --   --   --   --   --   HEPARINUNFRC  --   --   --   --   --   --   --   --  0.37  CREATININE 1.42* 1.30*  --   --   --  1.65*  --   --   --   TROPONINIHS 173*  --  5,970*  --  15,390* >24,000*  --   --   --    < > = values in this interval not displayed.    Estimated Creatinine Clearance: 50 mL/min (A) (by C-G formula based on SCr of 1.65 mg/dL (H)).   Medical History: Past Medical History:  Diagnosis Date   Heart disease     Medications:  Infusions:   sodium chloride Stopped (02/20/24 1027)   amiodarone 30 mg/hr (02/20/24 1500)   heparin 950 Units/hr (02/20/24 1500)   norepinephrine (LEVOPHED) Adult infusion 18 mcg/min (02/20/24 1825)   propofol (DIPRIVAN) infusion 50 mcg/kg/min (02/20/24 2001)   vasopressin 0.03 Units/min (02/20/24 1524)    Assessment: Patient is a 63 year old male with an extensive cardiac history who was found down in Vfib arrest due to proposed STEMI, complicated by seizures. Heparin was not started due to concern originally for head bleed, but ICH was ruled out on CT 10/25. Patient will not go to cath per cardiology. Pharmacy is consulted for IV heparin for 48 hours for ACS.  Initial heparin level is  therapeutic.  Goal of Therapy:  Heparin level 0.3-0.7 units/ml Monitor platelets by anticoagulation protocol: Yes   Plan:  Heparin 950 units/h Confirmatory level with am labs  Ozell Jamaica, PharmD, BCPS, [facility] Clinical Pharmacist [phone] Please check AMION for all [facility] Pharmacy numbers 02/20/2024

## 2024-02-20 NOTE — Progress Notes (Signed)
 Cardiology Progress Note  Patient ID: [name] MRN: [id number] DOB: 02-09-1961 Date of Encounter: 02/20/2024 Primary Cardiologist: None  Subjective   Chief Complaint: None.   HPI: VF arrest/STEMI yesterday.  Concerns for head bleed.  Not taken to Cath Lab.  Critically ill in the ICU likely in cardiogenic shock on pressors.  No further arrhythmias on amiodarone drip.  Patient is not reactive per nursing severe lactic acidosis.  Multiorgan system failure.  ROS:  All other ROS reviewed and negative. Pertinent positives noted in the HPI.     Telemetry  Overnight telemetry shows SR 90s, which I personally reviewed.   ECG  The most recent ECG shows junctional rhythm heart rate 64, anterior ST elevation, lateral ST elevation, inferior ST depressions, left bundle branch block, which I personally reviewed.   Physical Exam   Vitals:   02/20/24 0645 02/20/24 0700 02/20/24 0709 02/20/24 0800  BP: 117/88 121/86  115/86  Pulse: 82 83  91  Resp: (!) 28 (!) 26  (!) 31  Temp:   100.2 F (37.9 C)   TempSrc:   Bladder   SpO2: 99% 99%  100%  Weight:   77.1 kg   Height:       No intake or output data in the 24 hours ending 02/20/24 0856     02/20/2024    7:09 AM 02/19/2024    7:30 PM  Last 3 Weights  Weight (lbs) 169 lb 15.6 oz 154 lb 5.2 oz  Weight (kg) 77.1 kg 70 kg    Body mass index is 21.82 kg/m.  General: Ill-appearing Head: Laceration is noted to the left face Eyes: PEERLA, EOMI  Neck: Supple, no JVD Endocrine: No thryomegaly Cardiac: Normal S1, S2; RRR; no murmurs, rubs, or gallops Lungs: Diminished breath sounds Abd: Soft, nontender, no hepatomegaly  Ext: No edema, pulses 2+ Musculoskeletal: No deformities Skin: Warm and dry, no rashes   Neuro: Intubated and sedated on the ventilator  Cardiac Studies  2010  Ef 20% Ant STEMI s/p CABG  Patient Profile  [name] is a 63 y.o. male with CAD s/p CABG x 4 2010, HFrEF (EF20%), prior cocaine use admitted  02/19/2024 for VF arrest 2/2 STEMI. Prolonged downtime and cath deferred due to possible head bleed.    Assessment & Plan   # VF arrest # STEMI # Cardiogenic shock # Lactic acidosis # AKI - Apparently was found on [facility] property.  He had been found down.  Had significant lacerations to the left face.  Was found to have left bundle branch block with ST elevation.  There was concerns for possible head bleed so cardiac cath was deferred. - Cardiac arrest occurred around 6 PM.  He is more than 12 hours.  At this point we will just have to pursue medical management. - Repeat echo pending. - Continue pressor support as you are able.  Lactic acidosis improving. - Concerns for prolonged downtime.  The patient is not active or responding to commands per nursing.  He could have suffered anoxic brain injury. - Would recommend to pursue heparin drip for 48 hours once we have determined there is no head bleed.  He is going down for CT this morning. - I will order Lipitor 40 mg daily.  We can start aspirin as well on Plavix after his head CT is repeated and are sure there is no head bleed.  - Discussed case with critical care team.  They may consider hypothermic protocol.  I will  defer this to them. - For now, greater than 12 hours from STEMI and VF arrest.  We will pursue medical management for now.  Unclear benefit of going to the Cath Lab at this point. - Continue amiodarone for VF.  Drip recommended.  # CAD status post CABG # Ischemic cardiomyopathy - CABG in 2010.  Has not followed with cardiology since that time.  Reported to be homeless.  Supportive care as above.     For questions or updates, please contact [facility] Please consult [email or web address] for contact info under   CRITICAL CARE Performed by: Darryle T O'Neal  Total critical care time: 45 minutes. Critical care time was exclusive of separately billable procedures and treating other patients. Critical care was necessary to  treat or prevent imminent or life-threatening deterioration. Critical care was time spent personally by me on the following activities: development of treatment plan with patient and/or surrogate as well as nursing, discussions with consultants, evaluation of patient's response to treatment, examination of patient, obtaining history from patient or surrogate, ordering and performing treatments and interventions, ordering and review of laboratory studies, ordering and review of radiographic studies, pulse oximetry and re-evaluation of patient's condition.  Signed, Darryle DASEN. Barbaraann, MD, [facility]   [facility]  02/20/2024 9:16 AM

## 2024-02-21 ENCOUNTER — Inpatient Hospital Stay (HOSPITAL_COMMUNITY): Payer: MEDICAID

## 2024-02-21 DIAGNOSIS — J9601 Acute respiratory failure with hypoxia: Secondary | ICD-10-CM | POA: Diagnosis not present

## 2024-02-21 DIAGNOSIS — I213 ST elevation (STEMI) myocardial infarction of unspecified site: Secondary | ICD-10-CM | POA: Diagnosis not present

## 2024-02-21 DIAGNOSIS — I469 Cardiac arrest, cause unspecified: Secondary | ICD-10-CM | POA: Diagnosis not present

## 2024-02-21 DIAGNOSIS — R570 Cardiogenic shock: Secondary | ICD-10-CM | POA: Diagnosis not present

## 2024-02-21 DIAGNOSIS — N179 Acute kidney failure, unspecified: Secondary | ICD-10-CM | POA: Diagnosis not present

## 2024-02-21 DIAGNOSIS — R569 Unspecified convulsions: Secondary | ICD-10-CM

## 2024-02-21 DIAGNOSIS — I4901 Ventricular fibrillation: Secondary | ICD-10-CM | POA: Diagnosis not present

## 2024-02-21 DIAGNOSIS — G931 Anoxic brain damage, not elsewhere classified: Secondary | ICD-10-CM | POA: Diagnosis not present

## 2024-02-21 DIAGNOSIS — G9340 Encephalopathy, unspecified: Secondary | ICD-10-CM | POA: Diagnosis not present

## 2024-02-21 LAB — CBC
HCT: 51.5 % (ref 39.0–52.0)
Hemoglobin: 17 g/dL (ref 13.0–17.0)
MCH: 29.3 pg (ref 26.0–34.0)
MCHC: 33 g/dL (ref 30.0–36.0)
MCV: 88.8 fL (ref 80.0–100.0)
Platelets: 220 K/uL (ref 150–400)
RBC: 5.8 MIL/uL (ref 4.22–5.81)
RDW: 13.3 % (ref 11.5–15.5)
WBC: 23.6 K/uL — ABNORMAL HIGH (ref 4.0–10.5)
nRBC: 0 % (ref 0.0–0.2)

## 2024-02-21 LAB — GLUCOSE, CAPILLARY
Glucose-Capillary: 94 mg/dL (ref 70–99)
Glucose-Capillary: 82 mg/dL (ref 70–99)
Glucose-Capillary: 71 mg/dL (ref 70–99)
Glucose-Capillary: 84 mg/dL (ref 70–99)
Glucose-Capillary: 89 mg/dL (ref 70–99)
Glucose-Capillary: 126 mg/dL — ABNORMAL HIGH (ref 70–99)

## 2024-02-21 LAB — RENAL FUNCTION PANEL
Albumin: 3 g/dL — ABNORMAL LOW (ref 3.5–5.0)
Anion gap: 17 — ABNORMAL HIGH (ref 5–15)
BUN: 44 mg/dL — ABNORMAL HIGH (ref 8–23)
CO2: 23 mmol/L (ref 22–32)
Calcium: 8.5 mg/dL — ABNORMAL LOW (ref 8.9–10.3)
Chloride: 96 mmol/L — ABNORMAL LOW (ref 98–111)
Creatinine, Ser: 2.81 mg/dL — ABNORMAL HIGH (ref 0.61–1.24)
GFR, Estimated: 24 mL/min — ABNORMAL LOW (ref >60)
Glucose, Bld: 158 mg/dL — ABNORMAL HIGH (ref 70–99)
Phosphorus: 6.5 mg/dL — ABNORMAL HIGH (ref 2.5–4.6)
Potassium: 4.7 mmol/L (ref 3.5–5.1)
Sodium: 136 mmol/L (ref 135–145)

## 2024-02-21 LAB — HEPARIN LEVEL (UNFRACTIONATED): Heparin Unfractionated: 0.48 [IU]/mL (ref 0.30–0.70)

## 2024-02-21 LAB — POCT I-STAT 7, (LYTES, BLD GAS, ICA,H+H)
Acid-base deficit: 2 mmol/L (ref 0.0–2.0)
Bicarbonate: 22.6 mmol/L (ref 20.0–28.0)
Calcium, Ion: 1.08 mmol/L — ABNORMAL LOW (ref 1.15–1.40)
HCT: 45 % (ref 39.0–52.0)
Hemoglobin: 15.3 g/dL (ref 13.0–17.0)
O2 Saturation: 97 %
Patient temperature: 37.1
Potassium: 4.1 mmol/L (ref 3.5–5.1)
Sodium: 132 mmol/L — ABNORMAL LOW (ref 135–145)
TCO2: 24 mmol/L (ref 22–32)
pCO2 arterial: 36 mmHg (ref 32–48)
pH, Arterial: 7.405 (ref 7.35–7.45)
pO2, Arterial: 91 mmHg (ref 83–108)

## 2024-02-21 MED ORDER — FUROSEMIDE 10 MG/ML IJ SOLN
100.0000 mg | Freq: Once | INTRAMUSCULAR | Status: AC
  Administered 2024-02-21: 100 mg via INTRAVENOUS
  Filled 2024-02-21: qty 10

## 2024-02-21 MED ORDER — PIPERACILLIN-TAZOBACTAM 3.375 G IVPB
3.3750 g | Freq: Three times a day (TID) | INTRAVENOUS | Status: DC
  Administered 2024-02-21 – 2024-02-24 (×9): 3.375 g via INTRAVENOUS
  Filled 2024-02-21 (×9): qty 50

## 2024-02-21 MED ORDER — DEXTROSE 20 % IV SOLN
INTRAVENOUS | Status: AC
  Filled 2024-02-21 (×2): qty 500

## 2024-02-21 MED ORDER — LORAZEPAM 2 MG/ML IJ SOLN
2.0000 mg | Freq: Once | INTRAMUSCULAR | Status: DC
Start: 2024-02-21 — End: 2024-02-23

## 2024-02-21 MED ORDER — DEXMEDETOMIDINE HCL IN NACL 400 MCG/100ML IV SOLN
0.0000 ug/kg/h | INTRAVENOUS | Status: DC
  Administered 2024-02-21: 0.4 ug/kg/h via INTRAVENOUS
  Administered 2024-02-22 (×3): 0.6 ug/kg/h via INTRAVENOUS
  Administered 2024-02-23: 0.8 ug/kg/h via INTRAVENOUS
  Administered 2024-02-23: 1.2 ug/kg/h via INTRAVENOUS
  Administered 2024-02-23: 0.9 ug/kg/h via INTRAVENOUS
  Administered 2024-02-24 (×2): 1.1 ug/kg/h via INTRAVENOUS
  Administered 2024-02-24: 1 ug/kg/h via INTRAVENOUS
  Administered 2024-02-24: 1.1 ug/kg/h via INTRAVENOUS
  Administered 2024-02-24: 1 ug/kg/h via INTRAVENOUS
  Administered 2024-02-25 (×2): 1.2 ug/kg/h via INTRAVENOUS
  Administered 2024-02-25: 0.8 ug/kg/h via INTRAVENOUS
  Administered 2024-02-25: 0.9 ug/kg/h via INTRAVENOUS
  Administered 2024-02-26: 0.3 ug/kg/h via INTRAVENOUS
  Filled 2024-02-21 (×17): qty 100

## 2024-02-21 MED ORDER — LORAZEPAM 2 MG/ML IJ SOLN
INTRAMUSCULAR | Status: AC
  Filled 2024-02-21: qty 1

## 2024-02-21 MED ORDER — DEXTROSE 10 % IV SOLN: INTRAVENOUS | Status: DC

## 2024-02-21 NOTE — Progress Notes (Signed)
 NEUROLOGY CONSULT FOLLOW UP NOTE   Date of service: February 21, 2024 Patient Name: [name] MRN:  [id number] DOB:  August 15, 1960  Interval Hx/subjective   Patient exam remains extremely poor. Comatose, no cough/gag/corneal reflexes, no withdraw to pain.  EEG ictal-interictal.   Vitals   Vitals:   02/21/24 0558 02/21/24 0600 02/21/24 0700 02/21/24 0812  BP: 90/68  94/73   Pulse:      Resp: 18 17 18    Temp:  98.8 F (37.1 C) 98.4 F (36.9 C) (!) 97.3 F (36.3 C)  TempSrc:      SpO2:      Weight:      Height:         Body mass index is 21.3 kg/m.   Physical Exam    Constitutional: Critically ill Cardiovascular: SR. Requiring use of Pressors at high dose.  Respiratory: Mechanically ventilated on full support   Neurologic Examination performed on sedation with propofol at 50    Neuro: Mental Status: Patient is sedated.  Patient does not open eyes to voice or noxious stimuli.  Patient does not follow commands.     Cranial Nerves: With passive eye raise pupils are reactive. No corneal reflexes.Patient does not track examiner, EOMI not attempted due to pt being found down. ET tube in place, face seems subjectively symmetric with facial diplegia and tongue appears midline.  No cough or gag reflex.   Motor/Sensory: No spontaneous movement or withdrawal to pain in any extremity.  Medications  Current Facility-Administered Medications:    0.9 %  sodium chloride infusion, 250 mL, Intravenous, Continuous, Harold Scholz, MD, Held at 02/20/24 1027   acetaminophen  (TYLENOL ) tablet 650 mg, 650 mg, Per Tube, Q4H, 650 mg at 02/20/24 2210 **OR** acetaminophen  (TYLENOL ) 160 MG/5ML solution 650 mg, 650 mg, Per Tube, Q4H, 650 mg at 02/21/24 0601 **OR** acetaminophen  (TYLENOL ) suppository 650 mg, 650 mg, Rectal, Q4H, Chand, Sudham, MD   [START ON 02/22/2024] acetaminophen  (TYLENOL ) tablet 650 mg, 650 mg, Per Tube, Q4H PRN **OR** [START ON 02/22/2024] acetaminophen  (TYLENOL ) 160  MG/5ML solution 650 mg, 650 mg, Per Tube, Q4H PRN **OR** [START ON 02/22/2024] acetaminophen  (TYLENOL ) suppository 650 mg, 650 mg, Rectal, Q4H PRN, Harold Scholz, MD   amiodarone (NEXTERONE PREMIX) 360-4.14 MG/200ML-% (1.8 mg/mL) IV infusion, 30 mg/hr, Intravenous, Continuous, Tegeler, Lonni PARAS, MD, Last Rate: 16.67 mL/hr at 02/21/24 0700, 30 mg/hr at 02/21/24 0700   aspirin chewable tablet 81 mg, 81 mg, Per Tube, Daily, Harold Scholz, MD, 81 mg at 02/20/24 1134   atorvastatin (LIPITOR) tablet 40 mg, 40 mg, Per Tube, Daily, O'Neal, Darryle Ned, MD, 40 mg at 02/20/24 9043   Chlorhexidine Gluconate Cloth 2 % PADS 6 each, 6 each, Topical, Daily, Maree Harder, MD, 6 each at 02/21/24 0200   clopidogrel (PLAVIX) tablet 75 mg, 75 mg, Per Tube, Daily, Harold Scholz, MD, 75 mg at 02/20/24 1134   docusate (COLACE) 50 MG/5ML liquid 100 mg, 100 mg, Per Tube, BID PRN, Maree Harder, MD   docusate (COLACE) 50 MG/5ML liquid 100 mg, 100 mg, Per Tube, BID, Paliwal, Aditya, MD, 100 mg at 02/20/24 2210   fentaNYL (SUBLIMAZE) injection 50-200 mcg, 50-200 mcg, Intravenous, Q30 min PRN, Paliwal, Aditya, MD, 200 mcg at 02/20/24 0153   heparin ADULT infusion 100 units/mL (25000 units/250mL), 950 Units/hr, Intravenous, Continuous, Denninger, Jade M, RPH, Last Rate: 9.5 mL/hr at 02/21/24 0700, 950 Units/hr at 02/21/24 0700   insulin aspart (novoLOG) injection 0-9 Units, 0-9 Units, Subcutaneous, Q4H, Maree Harder, MD, 2  Units at 02/20/24 2356   levETIRAcetam (KEPPRA) undiluted injection 500 mg, 500 mg, Intravenous, Q12H, Lehner, Erin C, NP, 500 mg at 02/21/24 0547   midazolam PF (VERSED) injection 1-2 mg, 1-2 mg, Intravenous, Q1H PRN, Paliwal, Aditya, MD   norepinephrine (LEVOPHED) 4mg  in (0.016 mg/mL) premix infusion, 0-40 mcg/min, Intravenous, Titrated, Paliwal, Aditya, MD, Last Rate: 22.5 mL/hr at 02/21/24 0700, 6 mcg/min at 02/21/24 0700   ondansetron (ZOFRAN) injection 4 mg, 4 mg, Intravenous, Q6H PRN, Maree Harder, MD   Oral care mouth rinse, 15 mL, Mouth Rinse, Q2H, Maree Harder, MD, 15 mL at 02/21/24 0547   Oral care mouth rinse, 15 mL, Mouth Rinse, PRN, Maree Harder, MD   pantoprazole (PROTONIX) injection 40 mg, 40 mg, Intravenous, QHS, Maree Harder, MD, 40 mg at 02/20/24 2210   polyethylene glycol (MIRALAX / GLYCOLAX) packet 17 g, 17 g, Per Tube, Daily PRN, Maree Harder, MD   polyethylene glycol (MIRALAX / GLYCOLAX) packet 17 g, 17 g, Per Tube, Daily, Paliwal, Aditya, MD, 17 g at 02/20/24 0956   propofol (DIPRIVAN) 1000 MG/100ML infusion, 0-80 mcg/kg/min, Intravenous, Continuous, Chand, Valinda, MD, Last Rate: 21 mL/hr at 02/21/24 0700, 50 mcg/kg/min at 02/21/24 0700   sodium chloride flush (NS) 0.9 % injection 10-40 mL, 10-40 mL, Intracatheter, Q12H, Maree Harder, MD, 10 mL at 02/20/24 2211   sodium chloride flush (NS) 0.9 % injection 10-40 mL, 10-40 mL, Intracatheter, PRN, Maree Harder, MD   vasopressin (PITRESSIN) 20 Units in 100 mL (0.2 unit/mL) infusion-*FOR SHOCK*, 0-0.03 Units/min, Intravenous, Continuous, Harold Valinda, MD, Stopped at 02/21/24 0641  Labs and Diagnostic Imaging   CBC:  Recent Labs  Lab 02/19/24 2330 02/20/24 0550 02/20/24 0852 02/21/24 0212  WBC 22.8*  --   --  23.6*  HGB 17.8*   < > 17.3* 17.0  HCT 54.7*   < > 51.0 51.5  MCV 92.1  --   --  88.8  PLT 264  --   --  220   < > = values in this interval not displayed.    Basic Metabolic Panel:  Lab Results  Component Value Date   NA 136 02/21/2024   K 4.7 02/21/2024   CO2 23 02/21/2024   GLUCOSE 158 (H) 02/21/2024   BUN 44 (H) 02/21/2024   CREATININE 2.81 (H) 02/21/2024   CALCIUM 8.5 (L) 02/21/2024   GFRNONAA 24 (L) 02/21/2024   GFRAA  02/27/2009    >60        The eGFR has been calculated using the MDRD equation. This calculation has not been validated in all clinical situations. eGFR's persistently <60 mL/min signify possible Chronic Kidney Disease.   Lipid Panel:  Lab Results  Component Value  Date   LDLCALC 58 04/05/2009   HgbA1c:  Lab Results  Component Value Date   HGBA1C 5.1 02/19/2024   Urine Drug Screen:     Component Value Date/Time   LABOPIA NONE DETECTED 02/20/2024 0430   COCAINSCRNUR NONE DETECTED 02/20/2024 0430   LABBENZ NONE DETECTED 02/20/2024 0430   AMPHETMU NONE DETECTED 02/20/2024 0430   THCU NONE DETECTED 02/20/2024 0430   LABBARB NONE DETECTED 02/20/2024 0430    Alcohol Level     Component Value Date/Time   [facility] <15 02/19/2024 1751   INR  Lab Results  Component Value Date   INR 1.5 (H) 02/19/2024   APTT  Lab Results  Component Value Date   APTT (H) 02/23/2009    40  IF BASELINE aPTT IS ELEVATED, SUGGEST PATIENT RISK ASSESSMENT BE USED TO DETERMINE APPROPRIATE ANTICOAGULANT THERAPY.   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 But no cerebellar edema. And Etiology and significance of this finding is unclear. Recommend follow up with Neurosurgery. No acute intracranial hemorrhage or subdural blood. No acute intracranial abnormality. Severe chronic paranasal sinus disease.   CT Head without contrast (Personally reviewed): Moderate motion artifact limitations. High-density along the superior falx consistent with dural calcifications was present on prior exam, some of this appears slightly thicker than prior. This is favored to represent dural calcifications exacerbated by motion, however a small amount of subdural hemorrhage is difficult to exclude.  Expansile soft tissue lesion in the left maxillary sinus which erodes the anterior, medial, and posterolateral walls was present on prior exam but increased in size from prior.    Neurodiagnostics LTM EEG 02/20/2024 1015 to 02/21/2024 1000:  At the beginning of the study, EEG showed evidence of epileptogenicity with  generalized onset. This eeg pattern is on the ictal-interictal continuum with high suspicion for ictal nature. Gradually as medication were adjusted, EEG showed diffuse cerebral dysfunction (encephalopathy).    Assessment   [name] is a 63 y.o. male with hx of CAD status post PCI 2010 for anterior STEMI with subsequent CABG x 3, ischemic cardiomyopathy (EF 25 to 30%), CHF R EF who was found down 10/25 with unknown downtime.  CPR initiated 1655, noted to be in V-fib arrest.  Per EMS, shocked approximately 12 times.     On exam, patient is comatose, absent corneal cough and gag reflexes.  Pupils are reactive.  No reaction to noxious stimuli in any extremity.  EEG reviewed in room, appears flat. Official read shows inter-ictal continuum with high suspicion for ictal nature. Poor neuro exam with no brainstem reflexes except for reactive pupils BL.   Concern for significant Anoxic Brain Injury secondary to Cardiac Arrest with unknown downtime.  Recommendations   - Continue LTM EEG - Continue maintenance Keppra 500mg  BID. Will increase if he has more seizures. - next step if he continues to have seizures, will be to use Valproic acid - Wean Propofol by 10mcg/kg/min - MRI tomorrow from cardiac arrest to evaluate ABI further ______________________________________________________________________   Signed, Rocky JAYSON Likes, NP [facility]   NEUROHOSPITALIST ADDENDUM Performed a face to face diagnostic evaluation.   I have reviewed the contents of history and physical exam as documented by PA/ARNP/Resident and agree with above documentation.  I have discussed and formulated the above plan as documented. Edits to the note have been made as needed.  Kaheem Halleck, MD [facility] [phone]   If 7pm to 7am, please call on call as listed on AMION.

## 2024-02-21 NOTE — Progress Notes (Signed)
 PHARMACY - ANTICOAGULATION CONSULT NOTE  Pharmacy Consult for Heparin  Indication: chest pain/ACS  No Known Allergies  Patient Measurements: Height: 6' 2.02 (188 cm) Weight: 75.3 kg (165 lb 15.8 oz) IBW/kg (Calculated) : 82.25 HEPARIN DW (KG): 77.1  Vital Signs: Temp: 98.8 F (37.1 C) (10/26 1300) Temp Source: Bladder (10/26 0400) BP: 108/78 (10/26 1300) Pulse Rate: 71 (10/26 1015)  Labs: Recent Labs    02/19/24 1751 02/19/24 1801 02/19/24 2018 02/19/24 2029 02/19/24 2133 02/19/24 2330 02/20/24 0550 02/20/24 0852 02/20/24 1955 02/21/24 0212 02/21/24 1057  HGB 15.0 15.3  --    < >  --  17.8*   < > 17.3*  --  17.0 15.3  HCT 47.7 45.0  --    < >  --  54.7*   < > 51.0  --  51.5 45.0  PLT 233  --   --   --   --  264  --   --   --  220  --   LABPROT 18.5*  --   --   --   --   --   --   --   --   --   --   INR 1.5*  --   --   --   --   --   --   --   --   --   --   HEPARINUNFRC  --   --   --   --   --   --   --   --  0.37 0.48  --   CREATININE 1.42* 1.30*  --   --   --  1.65*  --   --   --  2.81*  --   TROPONINIHS 173*  --  5,970*  --  15,390* >24,000*  --   --   --   --   --    < > = values in this interval not displayed.    Estimated Creatinine Clearance: 28.7 mL/min (A) (by C-G formula based on SCr of 2.81 mg/dL (H)).   Medical History: Past Medical History:  Diagnosis Date   Heart disease     Medications:  Infusions:   amiodarone 30 mg/hr (02/21/24 1300)   dextrose 30 mL/hr at 02/21/24 1300   heparin 950 Units/hr (02/21/24 1300)   norepinephrine (LEVOPHED) Adult infusion 3 mcg/min (02/21/24 1300)   piperacillin-tazobactam (ZOSYN)  IV     propofol (DIPRIVAN) infusion 20 mcg/kg/min (02/21/24 1300)   vasopressin Stopped (02/21/24 9358)    Assessment: Patient is a 63 year old male with an extensive cardiac history who was found down in Vfib arrest due to proposed STEMI, complicated by seizures. Heparin was not started due to concern originally for head  bleed, but ICH was ruled out on CT 10/25. Patient will not go to cath per cardiology. Pharmacy is consulted for IV heparin for 48 hours for ACS.  Heparin level is therapeutic.  Goal of Therapy:  Heparin level 0.3-0.7 units/ml Monitor platelets by anticoagulation protocol: Yes   Plan:  Continue Heparin 950 units/h Confirmatory level with am labs  Vermell Mccallum, PharmD 02/21/2024

## 2024-02-21 NOTE — Plan of Care (Signed)
  Problem: Education: Goal: Ability to describe self-care measures that may prevent or decrease complications (Diabetes Survival Skills Education) will improve Outcome: Not Progressing   Problem: Coping: Goal: Ability to adjust to condition or change in health will improve Outcome: Not Progressing   Problem: Health Behavior/Discharge Planning: Goal: Ability to identify and utilize available resources and services will improve Outcome: Not Progressing   Problem: Health Behavior/Discharge Planning: Goal: Ability to manage health-related needs will improve Outcome: Not Progressing   Problem: Clinical Measurements: Goal: Ability to maintain clinical measurements within normal limits will improve Outcome: Not Progressing   Problem: Coping: Goal: Level of anxiety will decrease Outcome: Not Progressing

## 2024-02-21 NOTE — Progress Notes (Signed)
 LTM maint complete - no skin breakdown under:  Fp2, F8.

## 2024-02-21 NOTE — Plan of Care (Signed)
  Problem: Fluid Volume: Goal: Ability to maintain a balanced intake and output will improve Outcome: Progressing   Problem: Metabolic: Goal: Ability to maintain appropriate glucose levels will improve Outcome: Progressing   Problem: Clinical Measurements: Goal: Ability to maintain clinical measurements within normal limits will improve Outcome: Progressing Goal: Will remain free from infection Outcome: Progressing Goal: Diagnostic test results will improve Outcome: Progressing Goal: Respiratory complications will improve Outcome: Progressing Goal: Cardiovascular complication will be avoided Outcome: Progressing   Problem: Nutritional: Goal: Maintenance of adequate nutrition will improve Outcome: Not Progressing   Problem: Education: Goal: Knowledge of General Education information will improve Description: Including pain rating scale, medication(s)/side effects and non-pharmacologic comfort measures Outcome: Not Progressing

## 2024-02-21 NOTE — Progress Notes (Addendum)
 NAME:  [name], MRN:  [id number], DOB:  20-Mar-1961, LOS: 2 ADMISSION DATE:  02/19/2024, CONSULTATION DATE:  02/19/2024 REFERRING MD:  Lonni Sakai, MD, CHIEF COMPLAINT:  Found Down Outside Hospital   History of Present Illness:  63 y/o male with PMH for CAD who had a PCI in 2010 for anterior STEMI and found to have 3 vessel disease and went for CABG in 2010 (LIMA-LAD, SVG-Ramus, SVG -OM, SVG-RPDA), HFrEF, Ischemic Cardiomyopathy-last echo 2011 showing EF 25-30%, with akinesis to dyskinesis of the entire anterior, anteroseptal, and apical myocardium, and cocaine use disorder who was found down outside [facility] with unknown down time and CPR initiated 1655.  Patient noted to be in V fib arrest.  He was shocked approximately 12 times by EMS, IO placed and 450mg  Amiodarone, 6mg  Epinephrine, 1 liter IVF, 2gm Magnesium.  In ED patient on Levophed drip at 15mcg and Amiodarone drip.  He was intubated and sedated.  Code STEMI called, ST elevations in anteriolat leads and I spoke with Dr End, there is a potential ICH and so there is hesitation to take patient to cath lab given Heparin during cath. LA 9.3, Cr 1.30, WBC 13.8, INR 1.5, Glucose 303.  CT head also showing a soft tissue mass in the left maxillary sinus. Pertinent  Medical History  CAD who had a PCI in 2010 for anterior STEMI and found to have 3 vessel disease and went for CABG in 2010 (LIMA-LAD, SVG-Ramus, SVG -OM, SVG-RPDA), HFrEF, Ischemic Cardiomyopahty-last echo 2011 showing EF 25-30%, with akinesis to dyskinesis of the entire anterior, anteroseptal, and apical myocardium, and cocaine use disorder   Significant Hospital Events: Including procedures, antibiotic start and stop dates in addition to other pertinent events   10/24: Admit to ICU  Interim History / Subjective:  Patient remained intubated  EEG read is pending, he started having seizures immediately after placing him on EEG He is on TTM,  with low water temperature On vasopressin at 0.02 and Levophed at 4 mics.  Objective    Blood pressure 100/71, pulse 72, temperature 98.8 F (37.1 C), resp. rate 18, height 6' 2.02 (1.88 m), weight 75.3 kg, SpO2 100%.    Vent Mode: PSV;CPAP FiO2 (%):  [30 %-40 %] 30 % Set Rate:  [18 bmp] 18 bmp Vt Set:  [650 mL] 650 mL PEEP:  [5 cmH20] 5 cmH20 Pressure Support:  [8 cmH20] 8 cmH20 Plateau Pressure:  [16 cmH20-19 cmH20] 16 cmH20   Intake/Output Summary (Last 24 hours) at 02/21/2024 9078 Last data filed at 02/21/2024 0900 Gross per 24 hour  Intake 3198.84 ml  Output 1425 ml  Net 1773.84 ml   Filed Weights   02/20/24 0709 02/20/24 1103 02/21/24 0500  Weight: 77.1 kg 77.1 kg 75.3 kg    Examination: General: Crtitically ill-appearing male, orally intubated HEENT: Bruise mark noted on left side of forehead, staples in place on right eyebrow.  ETT and OGT in place Neuro: Sedated, not following commands.  Eyes are closed.  Positive pupillary reflex and triggering ventilator.  Absent cough, gag and corneal reflexes Chest: Coarse breath sounds, no wheezes or rhonchi Heart: Regular rate and rhythm, no murmurs or gallops Abdomen: Soft, nondistended, bowel sounds present  Labs and images reviewed  Patient Lines/Drains/Airways Status     Active Line/Drains/Airways     Name Placement date Placement time Site Days   Peripheral IV 02/19/24 Anterior;Distal;Left;Upper Arm 02/19/24  1832  Arm  1   Peripheral IV 02/19/24  Anterior;Left Forearm 02/19/24  --  Forearm  1   CVC Triple Lumen 02/19/24 Left Internal jugular 02/19/24  2241  -- 1   NG/OG Vented/Dual Lumen 16 Fr. Oral Marking at nare/corner of mouth 65 cm 02/19/24  1847  Oral  1   Urethral Catheter Thedora Ward, RN Temperature probe 16 Fr. 02/19/24  2053  Temperature probe  1   Airway 7 mm 02/19/24  1745  -- 1   Intraosseous Line 02/19/24 02/19/24  --  --  1        Resolved problem list  Acute high anion gap metabolic  acidosis/lactic acidosis Assessment and Plan  Acute anterio-lateral STEMI Status post out-of-hospital V-fib cardiac arrest Acute respiratory failure with hypoxia Probable aspiration pneumonia Acute encephalopathy, likely anoxic Myoclonic seizures Acute kidney injury due to ischemic ATN postcardiac arrest, worsening Lactic acidosis, improving Acute on chronic HFrEF due to ischemic cardiomyopathy with cardiogenic shock Chronic subdural hygroma.  Subdural hemorrhage was ruled out Left maxillary sinus mass Status post fall with forehead injuries  Continue supportive care Appreciate cardiology follow-up Continue IV heparin infusion to complete 48 hours therapy, continue aspirin, Plavix and statin Continue amiodarone, no more episodes of VT fib Continue lung protective ventilation VAP prevention bundle in place Currently on propofol for myoclonic seizures Appreciate neurology input Patient was loaded with Keppra, continue Keppra 500 mg twice daily Awaiting EEG read this morning Patient's white count is elevated, he is having thick respiratory secretions, likely aspirated during cardiac arrest Will send respiratory culture Started on IV Zosyn Serum creatinine continue to trend up, he made only 700 cc of urine in last 24 hours Will give him Lasix daily and 80 mg x 1 Monitor intake and output Avoid nephrotoxic agent Lactate is trending down Patient's ejection fraction is 15%, currently on vasopressor support, titrate with MAP goal 65 Outpatient follow-up with ENT once clinically stable Staples applied to right eyebrow, bruise mark noted on left side of the forehead   Labs   CBC: Recent Labs  Lab 02/19/24 1751 02/19/24 1801 02/19/24 2029 02/19/24 2330 02/20/24 0550 02/20/24 0852 02/21/24 0212  WBC 13.8*  --   --  22.8*  --   --  23.6*  HGB 15.0   < > 18.4* 17.8* 18.0* 17.3* 17.0  HCT 47.7   < > 54.0* 54.7* 53.0* 51.0 51.5  MCV 97.5  --   --  92.1  --   --  88.8  PLT 233  --    --  264  --   --  220   < > = values in this interval not displayed.    Basic Metabolic Panel: Recent Labs  Lab 02/19/24 1751 02/19/24 1801 02/19/24 2029 02/19/24 2330 02/20/24 0550 02/20/24 0852 02/21/24 0212  NA 135 137 134* 138 133* 138 136  K 3.8 3.7 3.8 3.5 3.9 3.7 4.7  CL 102 104  --  105  --   --  96*  CO2 14*  --   --  18*  --   --  23  GLUCOSE 304* 303*  --  135*  --   --  158*  BUN 16 22  --  26*  --   --  44*  CREATININE 1.42* 1.30*  --  1.65*  --   --  2.81*  CALCIUM 7.9*  --   --  8.1*  --   --  8.5*  MG  --   --   --  2.6*  --   --   --  PHOS  --   --   --  3.9  --   --  6.5*   GFR: Estimated Creatinine Clearance: 28.7 mL/min (A) (by C-G formula based on SCr of 2.81 mg/dL (H)). Recent Labs  Lab 02/19/24 1751 02/19/24 1801 02/19/24 2330 02/20/24 0814 02/20/24 1201 02/20/24 1414 02/21/24 0212  WBC 13.8*  --  22.8*  --   --   --  23.6*  LATICACIDVEN  --  9.3*  --  3.9* 4.9* 3.7*  --     Liver Function Tests: Recent Labs  Lab 02/19/24 1751 02/21/24 0212  AST 80*  --   ALT 39  --   ALKPHOS 58  --   BILITOT 0.9  --   PROT 5.6*  --   ALBUMIN 3.1* 3.0*   No results for input(s): LIPASE, AMYLASE in the last 168 hours. No results for input(s): AMMONIA in the last 168 hours.  ABG    Component Value Date/Time   PHART 7.440 02/20/2024 0852   PCO2ART 26.3 (L) 02/20/2024 0852   PO2ART 126 (H) 02/20/2024 0852   HCO3 18.0 (L) 02/20/2024 0852   TCO2 19 (L) 02/20/2024 0852   ACIDBASEDEF 5.0 (H) 02/20/2024 0852   O2SAT 99 02/20/2024 0852     Coagulation Profile: Recent Labs  Lab 02/19/24 1751  INR 1.5*    Cardiac Enzymes: No results for input(s): CKTOTAL, CKMB, CKMBINDEX, TROPONINI in the last 168 hours.  HbA1C: Hgb A1c MFr Bld  Date/Time Value Ref Range Status  02/19/2024 11:30 PM 5.1 4.8 - 5.6 % Final    Comment:    (NOTE) Diagnosis of Diabetes The following HbA1c ranges recommended by the American Diabetes Association  (ADA) may be used as an aid in the diagnosis of diabetes mellitus.  Hemoglobin             Suggested A1C NGSP%              Diagnosis  <5.7                   Non Diabetic  5.7-6.4                Pre-Diabetic  >6.4                   Diabetic  <7.0                   Glycemic control for                       adults with diabetes.    02/22/2009 04:00 AM  4.6 - 6.1 % Final   5.3 (NOTE) The ADA recommends the following therapeutic goal for glycemic control related to Hgb A1c measurement: Goal of therapy: <6.5 Hgb A1c  Reference: American Diabetes Association: Clinical Practice Recommendations 2010, Diabetes Care, 2010, 33: (Suppl  1).    CBG: Recent Labs  Lab 02/20/24 1545 02/20/24 1938 02/20/24 2325 02/21/24 0330 02/21/24 0740  GLUCAP 149* 161* 155* 94 82    The patient is critically ill due to acute STEMI/V-fib cardiac arrest/shock requiring titration of vasopressors.  Critical care was necessary to treat or prevent imminent or life-threatening deterioration.  Critical care was time spent personally by me on the following activities: development of treatment plan with patient and/or surrogate as well as nursing, discussions with consultants, evaluation of patient's response to treatment, examination of patient, obtaining history from patient or surrogate, ordering and performing treatments and  interventions, ordering and review of laboratory studies, ordering and review of radiographic studies, pulse oximetry, re-evaluation of patient's condition and participation in multidisciplinary rounds.   During this encounter critical care time was devoted to patient care services described in this note for 40 minutes.     Valinda Novas, MD [facility] See Amion for pager If no response to pager, please call [phone] until 7pm After 7pm, Please call E-link [phone]

## 2024-02-21 NOTE — Progress Notes (Signed)
 Cardiology Progress Note  Patient ID: [name] MRN: [id number] DOB: 1961/02/06 Date of Encounter: 02/21/2024 Primary Cardiologist: None  Subjective   Chief Complaint: Intubated  HPI: Concern for brain injury.  Seizures noted.  Remains on pressors.  No further arrhythmias.  In cardiogenic shock.  ROS:  All other ROS reviewed and negative. Pertinent positives noted in the HPI.     Telemetry  Overnight telemetry shows SR 70s, which I personally reviewed.   ECG  The most recent ECG shows sinus rhythm heart rate 98, left bundle branch block, anterior and lateral ST elevation, inferior ST depression, which I personally reviewed.   Physical Exam   Vitals:   02/21/24 0800 02/21/24 0812 02/21/24 0815 02/21/24 0825  BP: 99/71  96/69   Pulse:    73  Resp: 18 19 19 19   Temp:  (!) 97.3 F (36.3 C)    TempSrc:      SpO2:    100%  Weight:      Height:        Intake/Output Summary (Last 24 hours) at 02/21/2024 0848 Last data filed at 02/21/2024 0700 Gross per 24 hour  Intake 3059.63 ml  Output 1325 ml  Net 1734.63 ml       02/21/2024    5:00 AM 02/20/2024   11:03 AM 02/20/2024    7:09 AM  Last 3 Weights  Weight (lbs) 165 lb 15.8 oz 169 lb 15.6 oz 169 lb 15.6 oz  Weight (kg) 75.29 kg 77.1 kg 77.1 kg    Body mass index is 21.3 kg/m.  General: Intubated sedated on the vent Head: Trauma noted to the left face Eyes: No scleral icterus Neck: Supple, no JVD Endocrine: No thryomegaly Cardiac: Normal S1, S2; RRR; no murmurs, rubs, or gallops Lungs: Diminished breath sound bilaterally Abd: Soft, nontender, no hepatomegaly  Ext: No edema, pulses 2+ Musculoskeletal: No deformities Skin: Warm and dry, no rashes   Neuro: Intubated on the ventilator, not following commands  Cardiac Studies  TTE 02/20/2024  1. Left ventricular ejection fraction, by estimation, is 15%. The left  ventricle has severely decreased function. The left ventricle demonstrates  global hypokinesis  with wall thinning/scar in the septum. The left  ventricular internal cavity size was  moderately dilated. Left ventricular diastolic parameters are  indeterminate.   2. Right ventricular systolic function is severely reduced. The right  ventricular size is mildly enlarged.   3. The mitral valve is grossly normal. Trivial mitral valve  regurgitation.   4. The aortic valve is abnormal. Unable to determine aortic valve  morphology due to image quality. There is moderate calcification of the  aortic valve. Aortic valve regurgitation is not visualized.   5. Challenging study due to poor echo windows.   Patient Profile  [name] is a 63 y.o. male with CAD status post CABG x 4 in 2010, HFrEF EF 20%, prior cocaine abuse admitted on 02/19/2024 for VF arrest secondary to STEMI.  The patient had prolonged downtime and cath was deferred due to possible head bleed.  Currently in the ICU and cardiogenic shock.  Concerns for severe anoxic brain injury.  Assessment & Plan   # VF arrest # STEMI # Cardiogenic shock # Lactic acidosis # AKI # Systolic heart failure, EF 15% - Admitted with VF arrest.  Prolonged downtime.  Concerns initially for head bleed which has been ruled out.  Evaluated by neurology.  Now having seizures and concerns for anoxic brain injury. - Was deferred.  He is now greater than 24 hours out with his ST elevation myocardial infarction.  Given his poor neurological prognosis and multiorgan system failure he is not a candidate for invasive angiography. - He is in cardiogenic shock. - For now we will continue medical therapy.  Continue aspirin Plavix and heparin drip.  Heparin should be continued for 48 hours.  Primary team with hypothermia protocol. - No beta-blockers.  No other GDMT.  Continue Lipitor 40 mg daily. - For VF no further recurrence.  Continue amiodarone drip. - Currently on Levophed.  He is not a candidate for advanced heart failure therapies.  The patient is  homeless.  No family are available to discuss.  Discussed his case with critical care medicine.  We may be approaching futile care with severe anoxic brain injury. - Cardiology to follow along.  # Anoxic brain injury # Seizures - Per neurology and primary team.     For questions or updates, please contact [facility] Please consult [email or web address] for contact info under        Signed, Darryle T. Barbaraann, MD, [facility]  [facility]  02/21/2024 8:48 AM

## 2024-02-21 NOTE — Procedures (Signed)
 Patient Name: [name]  MRN: [id number]  Epilepsy Attending: Arlin MALVA Krebs  Referring Physician/Provider: Harold Scholz, MD  Duration: 02/20/2024 1015 to 02/21/2024 1015  Patient history: 63yo M s/p cardiac arrest. EEG to evaluate for seizure  Level of alertness: comatose  AEDs during EEG study: LEV, Propofol  Technical aspects: This EEG study was done with scalp electrodes positioned according to the 10-20 International system of electrode placement. Electrical activity was reviewed with band pass filter of 1-70Hz , sensitivity of 7 uV/mm, display speed of 59mm/sec with a 60Hz  notched filter applied as appropriate. EEG data were recorded continuously and digitally stored.  Video monitoring was available and reviewed as appropriate.  Description: At the beginning of the study, EEG showed burst suppression with highly epileptiform bursts of 3 to 6 Hz theta-delta slowing lasting 2-4 seconds alternating with 0.5-1 seconds of generalized suppressed. As medications were adjusted, epileptiform bursts resolved and EEG showed continuous slow generalized 3-6hz  theta-delta slowing admixed with 12-15hz  beta activity. Hyperventilation and photic stimulation were not performed.     ABNORMALITY - Burst suppression with highly epileptiform bursts, generalized - Continuous slow, generalized  IMPRESSION: At the beginning of the study, EEG showed evidence of epileptogenicity with generalized onset. This eeg pattern is on the ictal-interictal continuum with high suspicion for ictal nature. Gradually as medication were adjusted, EEG showed diffuse cerebral dysfunction (encephalopathy).    Jessicia Napolitano O Neizan Debruhl

## 2024-02-22 ENCOUNTER — Inpatient Hospital Stay (HOSPITAL_COMMUNITY): Payer: MEDICAID

## 2024-02-22 DIAGNOSIS — I480 Paroxysmal atrial fibrillation: Secondary | ICD-10-CM

## 2024-02-22 DIAGNOSIS — Z9911 Dependence on respirator [ventilator] status: Secondary | ICD-10-CM

## 2024-02-22 DIAGNOSIS — J9601 Acute respiratory failure with hypoxia: Secondary | ICD-10-CM | POA: Diagnosis not present

## 2024-02-22 DIAGNOSIS — I469 Cardiac arrest, cause unspecified: Secondary | ICD-10-CM | POA: Diagnosis not present

## 2024-02-22 DIAGNOSIS — D696 Thrombocytopenia, unspecified: Secondary | ICD-10-CM

## 2024-02-22 DIAGNOSIS — G931 Anoxic brain damage, not elsewhere classified: Secondary | ICD-10-CM

## 2024-02-22 DIAGNOSIS — I5021 Acute systolic (congestive) heart failure: Secondary | ICD-10-CM | POA: Diagnosis not present

## 2024-02-22 DIAGNOSIS — R569 Unspecified convulsions: Secondary | ICD-10-CM | POA: Diagnosis not present

## 2024-02-22 DIAGNOSIS — I4901 Ventricular fibrillation: Secondary | ICD-10-CM | POA: Diagnosis not present

## 2024-02-22 DIAGNOSIS — N179 Acute kidney failure, unspecified: Secondary | ICD-10-CM

## 2024-02-22 DIAGNOSIS — S0181XD Laceration without foreign body of other part of head, subsequent encounter: Secondary | ICD-10-CM

## 2024-02-22 DIAGNOSIS — R570 Cardiogenic shock: Secondary | ICD-10-CM | POA: Diagnosis not present

## 2024-02-22 LAB — BASIC METABOLIC PANEL WITH GFR
Anion gap: 14 (ref 5–15)
BUN: 52 mg/dL — ABNORMAL HIGH (ref 8–23)
CO2: 23 mmol/L (ref 22–32)
Calcium: 8 mg/dL — ABNORMAL LOW (ref 8.9–10.3)
Chloride: 98 mmol/L (ref 98–111)
Creatinine, Ser: 2.28 mg/dL — ABNORMAL HIGH (ref 0.61–1.24)
GFR, Estimated: 31 mL/min — ABNORMAL LOW (ref >60)
Glucose, Bld: 138 mg/dL — ABNORMAL HIGH (ref 70–99)
Potassium: 3.8 mmol/L (ref 3.5–5.1)
Sodium: 135 mmol/L (ref 135–145)

## 2024-02-22 LAB — CBC
HCT: 40.9 % (ref 39.0–52.0)
Hemoglobin: 14.2 g/dL (ref 13.0–17.0)
MCH: 30 pg (ref 26.0–34.0)
MCHC: 34.7 g/dL (ref 30.0–36.0)
MCV: 86.3 fL (ref 80.0–100.0)
Platelets: 146 K/uL — ABNORMAL LOW (ref 150–400)
RBC: 4.74 MIL/uL (ref 4.22–5.81)
RDW: 13.3 % (ref 11.5–15.5)
WBC: 11.7 K/uL — ABNORMAL HIGH (ref 4.0–10.5)
nRBC: 0 % (ref 0.0–0.2)

## 2024-02-22 LAB — RENAL FUNCTION PANEL
Albumin: 1.7 g/dL — ABNORMAL LOW (ref 3.5–5.0)
Anion gap: 15 (ref 5–15)
BUN: 44 mg/dL — ABNORMAL HIGH (ref 8–23)
CO2: 19 mmol/L — ABNORMAL LOW (ref 22–32)
Calcium: 5.5 mg/dL — CL (ref 8.9–10.3)
Chloride: 104 mmol/L (ref 98–111)
Creatinine, Ser: 1.89 mg/dL — ABNORMAL HIGH (ref 0.61–1.24)
GFR, Estimated: 39 mL/min — ABNORMAL LOW (ref >60)
Glucose, Bld: 169 mg/dL — ABNORMAL HIGH (ref 70–99)
Phosphorus: 5 mg/dL — ABNORMAL HIGH (ref 2.5–4.6)
Potassium: 2.4 mmol/L — CL (ref 3.5–5.1)
Sodium: 138 mmol/L (ref 135–145)

## 2024-02-22 LAB — GLUCOSE, CAPILLARY
Glucose-Capillary: 128 mg/dL — ABNORMAL HIGH (ref 70–99)
Glucose-Capillary: 139 mg/dL — ABNORMAL HIGH (ref 70–99)
Glucose-Capillary: 186 mg/dL — ABNORMAL HIGH (ref 70–99)
Glucose-Capillary: 44 mg/dL — CL (ref 70–99)
Glucose-Capillary: 54 mg/dL — ABNORMAL LOW (ref 70–99)
Glucose-Capillary: 74 mg/dL (ref 70–99)

## 2024-02-22 LAB — COOXEMETRY PANEL
Carboxyhemoglobin: 1.6 % — ABNORMAL HIGH (ref 0.5–1.5)
Methemoglobin: 0.7 % (ref 0.0–1.5)
O2 Saturation: 63.9 %
Total hemoglobin: 14.6 g/dL (ref 12.0–16.0)

## 2024-02-22 LAB — MAGNESIUM
Magnesium: 2.1 mg/dL (ref 1.7–2.4)
Magnesium: 2 mg/dL (ref 1.7–2.4)

## 2024-02-22 LAB — PHOSPHORUS: Phosphorus: 4.5 mg/dL (ref 2.5–4.6)

## 2024-02-22 LAB — HEPARIN LEVEL (UNFRACTIONATED)
Heparin Unfractionated: <0.1 [IU]/mL — ABNORMAL LOW (ref 0.30–0.70)
Heparin Unfractionated: 0.14 [IU]/mL — ABNORMAL LOW (ref 0.30–0.70)

## 2024-02-22 MED ORDER — VITAL 1.5 CAL PO LIQD
1000.0000 mL | ORAL | Status: DC
  Administered 2024-02-22 – 2024-02-28 (×6): 1000 mL
  Filled 2024-02-22: qty 1000

## 2024-02-22 MED ORDER — PROSOURCE TF20 ENFIT COMPATIBL EN LIQD
60.0000 mL | Freq: Every day | ENTERAL | Status: DC
  Administered 2024-02-22 – 2024-02-28 (×7): 60 mL
  Filled 2024-02-22 (×7): qty 60

## 2024-02-22 MED ORDER — POTASSIUM CHLORIDE 10 MEQ/100ML IV SOLN
10.0000 meq | INTRAVENOUS | Status: AC
  Administered 2024-02-22 (×3): 10 meq via INTRAVENOUS
  Filled 2024-02-22 (×3): qty 100

## 2024-02-22 MED ORDER — DEXTROSE 50 % IV SOLN
INTRAVENOUS | Status: AC
  Administered 2024-02-22: 50 mL
  Filled 2024-02-22: qty 50

## 2024-02-22 MED ORDER — HEPARIN SODIUM (PORCINE) 5000 UNIT/ML IJ SOLN
5000.0000 [IU] | Freq: Three times a day (TID) | INTRAMUSCULAR | Status: DC
  Administered 2024-02-22 – 2024-02-28 (×19): 5000 [IU] via SUBCUTANEOUS
  Filled 2024-02-22 (×19): qty 1

## 2024-02-22 MED ORDER — CALCIUM GLUCONATE 10 % IV SOLN
3.0000 g | Freq: Once | INTRAVENOUS | Status: AC
  Administered 2024-02-22: 3 g via INTRAVENOUS
  Filled 2024-02-22: qty 30

## 2024-02-22 MED ORDER — DEXTROSE 50 % IV SOLN
25.0000 g | INTRAVENOUS | Status: AC
  Administered 2024-02-22: 25 g via INTRAVENOUS

## 2024-02-22 MED ORDER — CALCIUM GLUCONATE-NACL 2-0.675 GM/100ML-% IV SOLN
2.0000 g | Freq: Once | INTRAVENOUS | Status: AC
  Administered 2024-02-22: 2000 mg via INTRAVENOUS
  Filled 2024-02-22: qty 100

## 2024-02-22 MED ORDER — THIAMINE MONONITRATE 100 MG PO TABS
100.0000 mg | ORAL_TABLET | Freq: Every day | ORAL | Status: AC
  Administered 2024-02-22 – 2024-02-26 (×5): 100 mg
  Filled 2024-02-22 (×5): qty 1

## 2024-02-22 MED ORDER — VITAL HP 1.0 CAL PO LIQD: 1000.0000 mL | ORAL | Status: DC

## 2024-02-22 NOTE — Progress Notes (Signed)
 Initial Nutrition Assessment  DOCUMENTATION CODES:  Non-severe (moderate) malnutrition in context of social or environmental circumstances  INTERVENTION:  Initiate tube feeding via OGT: Vital 1.5 at 60 ml/h (1440 ml per day) Start at 20 and advance by 10mL every 24 hours to reach goal Prosource TF20 60 ml 1x/d Provides 2240 kcal, 117 gm protein, 1100 ml free water daily Pt is at risk for refeeding syndrome given drug abuse and malnutrition. Monitor magnesium and phosphorus daily x 3 days, MD to replete as needed. 100mg  thiamine x 5 days  NUTRITION DIAGNOSIS:  Moderate Malnutrition related to social / environmental circumstances (drug use, homelessness) as evidenced by moderate fat depletion, severe muscle depletion.  GOAL:  Patient will meet greater than or equal to 90% of their needs  MONITOR:  TF tolerance, Skin, Vent status, Labs  REASON FOR ASSESSMENT:  Ventilator, Consult Enteral/tube feeding initiation and management  ASSESSMENT:  Pt with hx of CAD, CHF, and drug use admitted after being found down and unresponsive on sidewalk. Pt required 12 rounds of defibrillation and prolonged CPR until ROSC could be maintained. Intubated prior to arrival in ED. Pt also with concerns for head trauma.  10/24 - admitted to ICU, intubated   Patient is currently intubated on ventilator support. No family in room at this time. Pt discussed during ICU rounds and with RN and MD. Scheduled for MRI in the AM, concern for significant anoxic brain injury. Discussed with MD, ok to start nutrition. Unsure of nutrition hx but muscle and fat deficits are noted on exam and pt has been experiencing homelessness. Likely will be at risk for refeeding.   Pt currently on arctic sun and requiring pressor support. Will titrate slowly.  MV: 18.2 L/min Temp (24hrs), Avg:98.6 F (37 C), Min:97.9 F (36.6 C), Max:99.1 F (37.3 C) MAP (cuff): 63-71mmHg this shift  Admit weight: 77.1 kg  Current weight: 79.4  kg    Intake/Output Summary (Last 24 hours) at 02/22/2024 1641 Last data filed at 02/22/2024 1100 Gross per 24 hour  Intake 2317.53 ml  Output 1025 ml  Net 1292.53 ml  Net IO Since Admission: 3,176.19 mL [02/22/24 1641]  Drains/Lines: OGT 16 Fr. (Gastric) CVC Triple lumen, left IJ UOP 1775 mL/d  Nutritionally Relevant Medications: Scheduled Meds:  atorvastatin  40 mg Per Tube Daily   docusate  100 mg Per Tube BID   levETIRAcetam  500 mg Intravenous Q12H   pantoprazole IV  40 mg Intravenous QHS   polyethylene glycol  17 g Per Tube Daily   Continuous Infusions:  norepinephrine (LEVOPHED) Adult infusion 3 mcg/min (02/22/24 1100)   piperacillin-tazobactam (ZOSYN)  IV 3.375 g (02/22/24 1357)   PRN Meds: docusate, ondansetron, polyethylene glycol  Labs Reviewed: BUN 52, creatinine 2.28 Phosphorus 5.0 CBG ranges from 71-139 mg/dL over the last 24 hours HgbA1c 5.1%  NUTRITION - FOCUSED PHYSICAL EXAM: Flowsheet Row Most Recent Value  Orbital Region Moderate depletion  Upper Arm Region Severe depletion  Thoracic and Lumbar Region Unable to assess  [cooling pads on chest]  Buccal Region Moderate depletion  Temple Region Moderate depletion  Clavicle Bone Region Moderate depletion  Clavicle and Acromion Bone Region Severe depletion  Scapular Bone Region Moderate depletion  Dorsal Hand No depletion  Patellar Region Severe depletion  Anterior Thigh Region Severe depletion  Posterior Calf Region Mild depletion  Edema (RD Assessment) None  Hair Reviewed  Eyes Reviewed  Mouth Reviewed  Skin Reviewed  Nails Reviewed    Diet Order:   Diet  Order             Diet NPO time specified  Diet effective now                   EDUCATION NEEDS:  Not appropriate for education at this time  Skin:  Skin Assessment: Reviewed RN Assessment  Last BM:  10/27 - type 6  Height:  Ht Readings from Last 1 Encounters:  02/21/24 6' 2.02 (1.88 m)    Weight:  Wt Readings from  Last 1 Encounters:  02/22/24 79.4 kg    Ideal Body Weight:  86.4 kg  BMI:  Body mass index is 22.46 kg/m.  Estimated Nutritional Needs:  Kcal:  2200-2400 kcal/d Protein:  110-125g/d Fluid:  2L/d    Vernell Lukes, RD, LDN, CNSC Registered Dietitian II Please reach out via secure chat

## 2024-02-22 NOTE — Progress Notes (Signed)
 NAME:  [name], MRN:  [id number], DOB:  [birth year], LOS: 3 ADMISSION DATE:  02/19/2024, CONSULTATION DATE:  02/19/2024 REFERRING MD:  Lonni Sakai, MD, CHIEF COMPLAINT:  cardiac arrest  History of Present Illness:  63 y/o male with PMH for CAD who had a PCI in 2010 for anterior STEMI and found to have 3 vessel disease and went for CABG in 2010 (LIMA-LAD, SVG-Ramus, SVG -OM, SVG-RPDA), HFrEF, Ischemic Cardiomyopathy-last echo 2011 showing EF 25-30%, with akinesis to dyskinesis of the entire anterior, anteroseptal, and apical myocardium, and cocaine use disorder who was found down outside [facility] with unknown down time and CPR initiated 1655.  Patient noted to be in V fib arrest.  He was shocked approximately 12 times by EMS, IO placed and 450mg  Amiodarone, 6mg  Epinephrine, 1 liter IVF, 2gm Magnesium.  In ED patient on Levophed drip at 15mcg and Amiodarone drip.  He was intubated and sedated.  Code STEMI called, ST elevations in anteriolat leads and I spoke with Dr End, there is a potential ICH and so there is hesitation to take patient to cath lab given Heparin during cath. LA 9.3, Cr 1.30, WBC 13.8, INR 1.5, Glucose 303.  CT head also showing a soft tissue mass in the left maxillary sinus. Pertinent  Medical History  CAD who had a PCI in 2010 for anterior STEMI and found to have 3 vessel disease and went for CABG in 2010 (LIMA-LAD, SVG-Ramus, SVG -OM, SVG-RPDA), HFrEF, Ischemic Cardiomyopahty-last echo 2011 showing EF 25-30%, with akinesis to dyskinesis of the entire anterior, anteroseptal, and apical myocardium, and cocaine use disorder   Significant Hospital Events: Including procedures, antibiotic start and stop dates in addition to other pertinent events   10/24: Admit to ICU  Interim History / Subjective:  Remains on NE, amiodarone, precedex.   Objective    Blood pressure (!) 96/58, pulse 73, temperature 98.6 F (37 C), resp. rate 14, height  6' 2.02 (1.88 m), weight 79.4 kg, SpO2 100%.    Vent Mode: PRVC FiO2 (%):  [40 %] 40 % Set Rate:  [18 bmp] 18 bmp Vt Set:  [650 mL] 650 mL PEEP:  [5 cmH20] 5 cmH20 Pressure Support:  [3 cmH20] 3 cmH20 Plateau Pressure:  [11 cmH20-14 cmH20] 13 cmH20   Intake/Output Summary (Last 24 hours) at 02/22/2024 1616 Last data filed at 02/22/2024 1100 Gross per 24 hour  Intake 2317.53 ml  Output 1025 ml  Net 1292.53 ml   Filed Weights   02/20/24 1103 02/21/24 0500 02/22/24 0500  Weight: 77.1 kg 75.3 kg 79.4 kg    Examination: General: critically ill appearing man lying in bed in NAD HEENT: staples R forehead, ecchymoses around left eye Neuro: on low dose precedex. Reactive pupils and corneal reflexes. No cough or gag, no withdrawal from pain. Chest: faint rhonchi, no wheezing Heart: S1S2, RRR Abdomen: soft, NT  Resp culture: abdundant staph aureus, citrobacter, K pneumoniae  Resolved problem list  Acute high anion gap metabolic acidosis/lactic acidosis Lactic acidosis hypokalemia  Assessment and Plan  Acute anterio-lateral STEMI, S/p out-of-hospital V-fib cardiac arrest Acute on chronic HFrEF due to ischemic cardiomyopathy with cardiogenic shock New onset paroxysmal Afib -DAPT -amiodarone -completed heparin today -heparin for Afib -serial coox -wean off NE to maintain MAP >65 -monitor electrolytes, replete as needed  Acute respiratory failure with hypoxia Probable aspiration pneumonia -LTVV -VAP prevention protocol -PAD protocol -daily SAT & SBT as appropriate -zosyn, 7 days  Acute encephalopathy, likely anoxic  brain injury Myoclonic seizures -cEEG -appreciate neurology's management -MRI brain tomorrow morning   Acute kidney injury due to ischemic ATN postcardiac arrest, worsening -strict I/O -renally dose meds, avoid nephrotoxic meds -monitor  Hypocalcemia -repleted -monitor  Chronic subdural hygroma.  Subdural hemorrhage was ruled out Left maxillary  sinus mass -OP follow  Forehead ecchymoses, abrasions -supportive care  Thrombocytopenia, possibly due to antibiotics -monitor; ok to con't heparin for now -trend  Labs   CBC: Recent Labs  Lab 02/19/24 1751 02/19/24 1801 02/19/24 2330 02/20/24 0550 02/20/24 0852 02/21/24 0212 02/21/24 1057 02/22/24 1004  WBC 13.8*  --  22.8*  --   --  23.6*  --  11.7*  HGB 15.0   < > 17.8* 18.0* 17.3* 17.0 15.3 14.2  HCT 47.7   < > 54.7* 53.0* 51.0 51.5 45.0 40.9  MCV 97.5  --  92.1  --   --  88.8  --  86.3  PLT 233  --  264  --   --  220  --  146*   < > = values in this interval not displayed.    Basic Metabolic Panel: Recent Labs  Lab 02/19/24 1751 02/19/24 1801 02/19/24 2029 02/19/24 2330 02/20/24 0550 02/20/24 0852 02/21/24 0212 02/21/24 1057 02/22/24 0415 02/22/24 0800  NA 135 137   < > 138   < > 138 136 132* 138 135  K 3.8 3.7   < > 3.5   < > 3.7 4.7 4.1 2.4* 3.8  CL 102 104  --  105  --   --  96*  --  104 98  CO2 14*  --   --  18*  --   --  23  --  19* 23  GLUCOSE 304* 303*  --  135*  --   --  158*  --  169* 138*  BUN 16 22  --  26*  --   --  44*  --  44* 52*  CREATININE 1.42* 1.30*  --  1.65*  --   --  2.81*  --  1.89* 2.28*  CALCIUM 7.9*  --   --  8.1*  --   --  8.5*  --  5.5* 8.0*  MG  --   --   --  2.6*  --   --   --   --   --  2.1  PHOS  --   --   --  3.9  --   --  6.5*  --  5.0*  --    < > = values in this interval not displayed.      This patient is critically ill with multiple organ system failure which requires frequent high complexity decision making, assessment, support, evaluation, and titration of therapies. This was completed through the application of advanced monitoring technologies and extensive interpretation of multiple databases. During this encounter critical care time was devoted to patient care services described in this note for 36 minutes.  Leita SHAUNNA Gaskins, DO 02/22/24 4:31 PM [facility]  For contact information, see  Amion. If no response to pager, please call PCCM consult pager. After hours, 7PM- 7AM, please call Elink.

## 2024-02-22 NOTE — Progress Notes (Signed)
 Heart Failure Navigator Progress Note  Assessed for Heart & Vascular TOC clinic readiness.  Patient does not meet criteria due to per MD notes , patient with very poor prognosis. No HF TOC.   Navigator will sign off at this time.   Stephane Haddock, BSN, Scientist, Clinical (histocompatibility And Immunogenetics) Only

## 2024-02-22 NOTE — Procedures (Addendum)
 Patient Name: [name]  MRN: [id number]  Epilepsy Attending: Arlin MALVA Krebs  Referring Physician/Provider: Harold Scholz, MD  Duration: 02/21/2024 1015 to 02/22/2024 1015   Patient history: 63yo M s/p cardiac arrest. EEG to evaluate for seizure   Level of alertness: comatose   AEDs during EEG study: LEV   Technical aspects: This EEG study was done with scalp electrodes positioned according to the 10-20 International system of electrode placement. Electrical activity was reviewed with band pass filter of 1-70Hz , sensitivity of 7 uV/mm, display speed of 79mm/sec with a 60Hz  notched filter applied as appropriate. EEG data were recorded continuously and digitally stored.  Video monitoring was available and reviewed as appropriate.   Description: EEG initially showed continuous slow generalized 3-6hz  theta-delta slowing admixed with 12-15hz  beta activity. Gradually as propofol was weaned off, EEG showed near continuous generalized predominantly 6 to 8 Hz theta-alpha activity admixed with 2 to 3 Hz delta slowing. Hyperventilation and photic stimulation were not performed.     Event button was pressed on 02/21/2024 at 1554.  Per RN, patient had eye twitching.  Concomitant EEG before, during and after the event showed generalized 8 to 9 Hz alpha activity, not reactive to stimulation.   ABNORMALITY - Continuous slow, generalized   IMPRESSION: This study was suggestive of generalized  cerebral dysfunction (encephalopathy).  No seizures or definite epileptiform discharges were noted.  Event button was pressed on 02/21/2024 at 1554 for eyelid twitching without concomitant EEG change.  This was most likely a nonepileptic event.    [name]

## 2024-02-22 NOTE — Progress Notes (Signed)
 LTM maint complete - no skin breakdown under:  Fz, A1.

## 2024-02-22 NOTE — Progress Notes (Signed)
 Cardiology Progress Note  Patient ID: [name] MRN: [id number] DOB: January 20, 1961 Date of Encounter: 02/22/2024 Primary Cardiologist: None  Subjective   Chief Complaint: None.   HPI: Remains on the ventilator.  Persistent seizures.  Concerns for anoxic brain injury.  ROS:  All other ROS reviewed and negative. Pertinent positives noted in the HPI.     Telemetry  Overnight telemetry shows sinus rhythm 70s, which I personally reviewed.    Physical Exam   Vitals:   02/22/24 0915 02/22/24 0930 02/22/24 0945 02/22/24 0949  BP: 112/73 91/68 (!) 82/58   Pulse: 76  (!) 143   Resp: 16 16 19 17   Temp:      TempSrc:      SpO2: 91%  (!) 66%   Weight:      Height:        Intake/Output Summary (Last 24 hours) at 02/22/2024 0956 Last data filed at 02/22/2024 0900 Gross per 24 hour  Intake 2553.72 ml  Output 2000 ml  Net 553.72 ml       02/22/2024    5:00 AM 02/21/2024    5:00 AM 02/20/2024   11:03 AM  Last 3 Weights  Weight (lbs) 175 lb 0.7 oz 165 lb 15.8 oz 169 lb 15.6 oz  Weight (kg) 79.4 kg 75.29 kg 77.1 kg    Body mass index is 22.46 kg/m.  General: Ill-appearing Head: Lacerations to the left face Eyes: PEERLA, EOMI  Neck: Supple, no JVD Endocrine: No thryomegaly Cardiac: Normal S1, S2; RRR; no murmurs, rubs, or gallops Lungs: Diminished breath sounds bilaterally Abd: Soft, nontender, no hepatomegaly  Ext: No edema, pulses 2+ Musculoskeletal: No deformities Skin: Warm and dry, no rashes   Neuro: Intubated on the ventilator  Cardiac Studies  TTE 02/20/2024  1. Left ventricular ejection fraction, by estimation, is 15%. The left  ventricle has severely decreased function. The left ventricle demonstrates  global hypokinesis with wall thinning/scar in the septum. The left  ventricular internal cavity size was  moderately dilated. Left ventricular diastolic parameters are  indeterminate.   2. Right ventricular systolic function is severely reduced. The right   ventricular size is mildly enlarged.   3. The mitral valve is grossly normal. Trivial mitral valve  regurgitation.   4. The aortic valve is abnormal. Unable to determine aortic valve  morphology due to image quality. There is moderate calcification of the  aortic valve. Aortic valve regurgitation is not visualized.   5. Challenging study due to poor echo windows.   Patient Profile  [name] is a 63 y.o. male with CAD status post CABG in 2010, HFrEF (EF 20%), prior cocaine abuse admitted on 02/19/2024 for VF arrest secondary to STEMI.  Course complicated by cardiogenic shock, seizure and anoxic brain injury.  Assessment & Plan   # VF arrest # STEMI  # Cardiogenic shock # Lactic acidosis # AKI # Acute systolic heart failure, EF 15% - Admitted with VF arrest and likely prolonged downtime.  Initial concern for head bleed.  EKG showed ST elevation but cardiac cath was not pursued.  There was no head bleed.  He is now having seizures and neurologically there is concerns for poor prognosis. - Remains in shock on pressors.  Not a candidate for advanced therapies.  His neurological condition will preclude this.  He is also homeless without any family per reports. - No further arrhythmias.  Continue amiodarone drip for now. - He has completed 48 hours of heparin.  Okay to  continue aspirin and Plavix. - No GDMT since he is on pressors. - Continue Lipitor 40 mg daily. - No beta-blockers. -I have ordered a Co. ox.  This will let us  know the state of his heart function.  I suspect it is not good. - Overall, very poor prognosis.  Discussed this case with the critical care medicine team.  Cardiology will sign off at this time.  Please call us  with questions.  # Anoxic brain injury  # Seizures - Per neurology.  # Goals of care - Difficult situation.  No family.  Care is approaching futility.  Discussed this with critical care medicine team.  Suspect ethics will need to be involved.  [facility] will sign off.   Cardiology to sign off at this time.  Please call with questions.  For questions or updates, please contact [facility] Please consult [email or web address] for contact info under        Signed, Darryle T. Barbaraann, MD, [facility]  [facility]  02/22/2024 9:56 AM

## 2024-02-22 NOTE — Progress Notes (Signed)
 NEUROLOGY CONSULT FOLLOW UP NOTE   Date of service: February 22, 2024 Patient Name: [name] MRN:  [id number] DOB:  02-13-1961  Interval Hx/subjective   Seen and examined Prop weaned On precedex  Vitals   Vitals:   02/22/24 0630 02/22/24 0700 02/22/24 0800 02/22/24 0803  BP: 103/75 108/73 113/88   Pulse:    67  Resp: 16 19 15  (!) 21  Temp:  98.8 F (37.1 C) 99 F (37.2 C)   TempSrc:  Bladder Bladder   SpO2:    98%  Weight:      Height:         Body mass index is 22.46 kg/m.   Physical Exam    General: Intubated on no sedation HEENT: Bruise left eyelid. CVS: Regular rate rhythm Respiratory: Vented   Neurologic Examination performed on sedation with propofol at 50    Neuro: Mental Status: Examined on Precedex. Patient is sedated.  Patient does not open eyes to voice or noxious stimuli.  Patient does not follow commands.     Cranial Nerves: Pupils sluggish, gaze is downward with negative OCR, sluggish corneals, face symmetric   Motor/Sensory: Question attempting localizing with b/l UE Noxious stimulation to bilateral lower extremities also led to some flicker in his upper extremities.  Medications  Current Facility-Administered Medications:    acetaminophen  (TYLENOL ) tablet 650 mg, 650 mg, Per Tube, Q4H PRN **OR** acetaminophen  (TYLENOL ) 160 MG/5ML solution 650 mg, 650 mg, Per Tube, Q4H PRN **OR** acetaminophen  (TYLENOL ) suppository 650 mg, 650 mg, Rectal, Q4H PRN, Harold Scholz, MD   amiodarone (NEXTERONE PREMIX) 360-4.14 MG/200ML-% (1.8 mg/mL) IV infusion, 30 mg/hr, Intravenous, Continuous, Tegeler, Lonni PARAS, MD, Last Rate: 16.67 mL/hr at 02/22/24 0800, 30 mg/hr at 02/22/24 0800   aspirin chewable tablet 81 mg, 81 mg, Per Tube, Daily, Harold Scholz, MD, 81 mg at 02/21/24 0908   atorvastatin (LIPITOR) tablet 40 mg, 40 mg, Per Tube, Daily, O'Neal, Darryle Ned, MD, 40 mg at 02/21/24 0908   calcium gluconate 3 g in sodium chloride 0.9 % 100 mL IVPB, 3  g, Intravenous, Once, Joelyn Satterfield, MD, Paused at 02/22/24 0758   Chlorhexidine Gluconate Cloth 2 % PADS 6 each, 6 each, Topical, Daily, Maree Harder, MD, 6 each at 02/21/24 0200   clopidogrel (PLAVIX) tablet 75 mg, 75 mg, Per Tube, Daily, Harold Scholz, MD, 75 mg at 02/21/24 0908   dexmedetomidine (PRECEDEX) 400 MCG/100ML (4 mcg/mL) infusion, 0-1.2 mcg/kg/hr, Intravenous, Titrated, Chand, Scholz, MD, Last Rate: 11.3 mL/hr at 02/22/24 0800, 0.6 mcg/kg/hr at 02/22/24 0800   dextrose 20 % infusion, , Intravenous, Continuous, Chand, Scholz, MD, Last Rate: 30 mL/hr at 02/22/24 0800, Infusion Verify at 02/22/24 0800   docusate (COLACE) 50 MG/5ML liquid 100 mg, 100 mg, Per Tube, BID PRN, Maree Harder, MD   docusate (COLACE) 50 MG/5ML liquid 100 mg, 100 mg, Per Tube, BID, Paliwal, Aditya, MD, 100 mg at 02/21/24 2100   fentaNYL (SUBLIMAZE) injection 50-200 mcg, 50-200 mcg, Intravenous, Q30 min PRN, Paliwal, Aditya, MD, 200 mcg at 02/20/24 0153   heparin ADULT infusion 100 units/mL (25000 units/250mL), 950 Units/hr, Intravenous, Continuous, Denninger, Jade M, RPH, Last Rate: 9.5 mL/hr at 02/22/24 0800, 950 Units/hr at 02/22/24 0800   levETIRAcetam (KEPPRA) undiluted injection 500 mg, 500 mg, Intravenous, Q12H, Lehner, Erin C, NP, 500 mg at 02/22/24 0518   LORazepam (ATIVAN) injection 2 mg, 2 mg, Intravenous, Once, Khaliqdina, Salman, MD   midazolam PF (VERSED) injection 1-2 mg, 1-2 mg, Intravenous, Q1H PRN, Paliwal, Aditya,  MD   norepinephrine (LEVOPHED) 4mg  in (0.016 mg/mL) premix infusion, 0-40 mcg/min, Intravenous, Titrated, Paliwal, Aditya, MD, Last Rate: 22.5 mL/hr at 02/22/24 0800, 6 mcg/min at 02/22/24 0800   ondansetron (ZOFRAN) injection 4 mg, 4 mg, Intravenous, Q6H PRN, Maree Harder, MD   Oral care mouth rinse, 15 mL, Mouth Rinse, Q2H, Maree Harder, MD, 15 mL at 02/22/24 0754   Oral care mouth rinse, 15 mL, Mouth Rinse, PRN, Maree Harder, MD   pantoprazole (PROTONIX) injection 40 mg, 40 mg,  Intravenous, QHS, Maree Harder, MD, 40 mg at 02/21/24 2100   piperacillin-tazobactam (ZOSYN) IVPB 3.375 g, 3.375 g, Intravenous, Q8H, Chand, Valinda, MD, Paused at 02/22/24 0758   polyethylene glycol (MIRALAX / GLYCOLAX) packet 17 g, 17 g, Per Tube, Daily PRN, Maree Harder, MD   polyethylene glycol (MIRALAX / GLYCOLAX) packet 17 g, 17 g, Per Tube, Daily, Paliwal, Aditya, MD, 17 g at 02/21/24 0908   potassium chloride 10 mEq in 100 mL IVPB, 10 mEq, Intravenous, Q1 Hr x 3, Osei, Jerilynn, MD, Last Rate: 100 mL/hr at 02/22/24 0800, Infusion Verify at 02/22/24 0800   propofol (DIPRIVAN) 1000 MG/100ML infusion, 0-80 mcg/kg/min, Intravenous, Continuous, Chand, Valinda, MD, Stopped at 02/21/24 1450   sodium chloride flush (NS) 0.9 % injection 10-40 mL, 10-40 mL, Intracatheter, Q12H, Maree Harder, MD, 10 mL at 02/21/24 2100   sodium chloride flush (NS) 0.9 % injection 10-40 mL, 10-40 mL, Intracatheter, PRN, Maree Harder, MD   vasopressin (PITRESSIN) 20 Units in 100 mL (0.2 unit/mL) infusion-*FOR SHOCK*, 0-0.03 Units/min, Intravenous, Continuous, Harold Valinda, MD, Stopped at 02/21/24 0641  Labs and Diagnostic Imaging   CBC:  Recent Labs  Lab 02/19/24 2330 02/20/24 0550 02/21/24 0212 02/21/24 1057  WBC 22.8*  --  23.6*  --   HGB 17.8*   < > 17.0 15.3  HCT 54.7*   < > 51.5 45.0  MCV 92.1  --  88.8  --   PLT 264  --  220  --    < > = values in this interval not displayed.    Basic Metabolic Panel:  Lab Results  Component Value Date   NA 138 02/22/2024   K 2.4 (LL) 02/22/2024   CO2 19 (L) 02/22/2024   GLUCOSE 169 (H) 02/22/2024   BUN 44 (H) 02/22/2024   CREATININE 1.89 (H) 02/22/2024   CALCIUM 5.5 (LL) 02/22/2024   GFRNONAA 39 (L) 02/22/2024   GFRAA  02/27/2009    >60        The eGFR has been calculated using the MDRD equation. This calculation has not been validated in all clinical situations. eGFR's persistently <60 mL/min signify possible Chronic Kidney Disease.   Lipid Panel:   Lab Results  Component Value Date   LDLCALC 58 04/05/2009   HgbA1c:  Lab Results  Component Value Date   HGBA1C 5.1 02/19/2024   Urine Drug Screen:     Component Value Date/Time   LABOPIA NONE DETECTED 02/20/2024 0430   COCAINSCRNUR NONE DETECTED 02/20/2024 0430   LABBENZ NONE DETECTED 02/20/2024 0430   AMPHETMU NONE DETECTED 02/20/2024 0430   THCU NONE DETECTED 02/20/2024 0430   LABBARB NONE DETECTED 02/20/2024 0430    Alcohol Level     Component Value Date/Time   [facility] <15 02/19/2024 1751   INR  Lab Results  Component Value Date   INR 1.5 (H) 02/19/2024   APTT  Lab Results  Component Value Date   APTT (H) 02/23/2009    40  IF BASELINE aPTT IS ELEVATED, SUGGEST PATIENT RISK ASSESSMENT BE USED TO DETERMINE APPROPRIATE ANTICOAGULANT THERAPY.   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 But no cerebellar edema. And Etiology and significance of this finding is unclear. Recommend follow up with Neurosurgery. No acute intracranial hemorrhage or subdural blood. No acute intracranial abnormality. Severe chronic paranasal sinus disease.   CT Head without contrast (Personally reviewed): Moderate motion artifact limitations. High-density along the superior falx consistent with dural calcifications was present on prior exam, some of this appears slightly thicker than prior. This is favored to represent dural calcifications exacerbated by motion, however a small amount of subdural hemorrhage is difficult to exclude.  Expansile soft tissue lesion in the left maxillary sinus which erodes the anterior, medial, and posterolateral walls was present on prior exam but increased in size from prior.    Neurodiagnostics LTM EEG 02/20/2024 1015 to 02/21/2024 1000:  At the beginning of the study, EEG showed evidence  of epileptogenicity with generalized onset. This eeg pattern is on the ictal-interictal continuum with high suspicion for ictal nature. Gradually as medication were adjusted, EEG showed diffuse cerebral dysfunction (encephalopathy).    LTM EEG 02/21/2024 to 02/22/2024: Generalized cerebral dysfunction: No seizures or epileptiform discharges.  Event button pressed on 02/21/2024 for eyelid twitching without concomitant EEG change  Assessment   [name] is a 63 y.o. male with hx of CAD status post PCI 2010 for anterior STEMI with subsequent CABG x 3, ischemic cardiomyopathy (EF 25 to 30%), CHF R EF who was found down 10/25 with unknown downtime.  CPR initiated 1655, noted to be in V-fib arrest.  Per EMS, shocked approximately 12 times.     On exam, patient is comatose, but does have sluggish brainstem reflexes as above, which is somewhat different than yesterday.  I would imagine sedation was contributing.  Long-term EEG unremarkable for seizures.   Concern for significant Anoxic Brain Injury secondary to Cardiac Arrest with unknown downtime.  Recommendations  - Continue LTM EEG for 1 more day.  If remains negative, will discontinue tomorrow. - Continue maintenance Keppra 500mg  BID. Will increase if he has more seizures. - next step if he continues to have seizures, will be to use Valproic acid -Continue off of propofol - MRI 02/23/24 to evaluate for hypoxic/anoxic brain injury  Plan discussed with Dr. Gretta ______________________________________________________________________  -- Eligio Lav, MD Neurologist [facility]  CRITICAL CARE ATTESTATION Performed by: Eligio Lav, MD Total critical care time: 31 minutes Critical care time was exclusive of separately billable procedures and treating other patients and/or supervising APPs/Residents/Students Critical care was necessary to treat or prevent imminent or life-threatening deterioration. This patient is critically  ill and at significant risk for neurological worsening and/or death and care requires constant monitoring. Critical care was time spent personally by me on the following activities: development of treatment plan with patient and/or surrogate as well as nursing, discussions with consultants, evaluation of patient's response to treatment, examination of patient, obtaining history from patient or surrogate, ordering and performing treatments and interventions, ordering and review of laboratory studies, ordering and review of radiographic studies, pulse oximetry, re-evaluation of patient's condition, participation in multidisciplinary rounds and medical decision making of high complexity in the care of this patient.

## 2024-02-22 NOTE — TOC CM/SW Note (Addendum)
 Transition of Care [facility]) - Inpatient Brief Assessment   Patient Details  Name: SYRUS NAKAMA MRN: [id number] Date of Birth: Jan 12, 1961  Transition of Care [facility]) CM/SW Contact:    [name], LCSWA Phone Number: 02/22/2024, 12:50 PM   Clinical Narrative:  12:50 PM Per chart review, patient has a PCP and insurance. Patient does not have SNF/HH/DME history. Patient's preferred pharmacy's are [facility], [facility] [address] [facility] [address]. [facility] consult was placed for homelessness and locating medical decision makers. CSW contacted congregational nursing. Contact information for Jamear Carbonneau (unknown relation; [phone]) was provided. Patient is currently intubated with feeding tube. CSW will continue to follow.   Transition of Care Asessment: Insurance and Status: Insurance coverage has been reviewed Patient has primary care physician: Yes Home environment has been reviewed: N/A Prior level of function:: N/A Prior/Current Home Services: No current home services Social Drivers of Health Review:  (Patient is currently intubated) Readmission risk has been reviewed: Yes (Currently Yellow 17%) Transition of care needs: transition of care needs identified, TOC will continue to follow

## 2024-02-22 NOTE — Progress Notes (Signed)
 eLink Physician-Brief Progress Note Patient Name: [name] DOB: Jun 24, 1960 MRN: [id number]   Date of Service  02/22/2024  HPI/Events of Note  Hypocalcemia and hypokalemia on a.m. labs.  eICU Interventions  Replacement ordered.     Intervention Category Minor Interventions: Electrolytes abnormality - evaluation and management  Jerilynn Berg 02/22/2024, 5:28 AM

## 2024-02-23 ENCOUNTER — Inpatient Hospital Stay (HOSPITAL_COMMUNITY): Payer: MEDICAID

## 2024-02-23 DIAGNOSIS — I469 Cardiac arrest, cause unspecified: Secondary | ICD-10-CM | POA: Diagnosis not present

## 2024-02-23 DIAGNOSIS — G931 Anoxic brain damage, not elsewhere classified: Secondary | ICD-10-CM | POA: Diagnosis not present

## 2024-02-23 DIAGNOSIS — R569 Unspecified convulsions: Secondary | ICD-10-CM | POA: Diagnosis not present

## 2024-02-23 DIAGNOSIS — E440 Moderate protein-calorie malnutrition: Secondary | ICD-10-CM | POA: Insufficient documentation

## 2024-02-23 DIAGNOSIS — J9601 Acute respiratory failure with hypoxia: Secondary | ICD-10-CM | POA: Diagnosis not present

## 2024-02-23 DIAGNOSIS — I480 Paroxysmal atrial fibrillation: Secondary | ICD-10-CM | POA: Diagnosis not present

## 2024-02-23 LAB — BASIC METABOLIC PANEL WITH GFR
Anion gap: 10 (ref 5–15)
BUN: 46 mg/dL — ABNORMAL HIGH (ref 8–23)
CO2: 27 mmol/L (ref 22–32)
Calcium: 7.6 mg/dL — ABNORMAL LOW (ref 8.9–10.3)
Chloride: 97 mmol/L — ABNORMAL LOW (ref 98–111)
Creatinine, Ser: 1.44 mg/dL — ABNORMAL HIGH (ref 0.61–1.24)
GFR, Estimated: 55 mL/min — ABNORMAL LOW (ref >60)
Glucose, Bld: 136 mg/dL — ABNORMAL HIGH (ref 70–99)
Potassium: 3.8 mmol/L (ref 3.5–5.1)
Sodium: 134 mmol/L — ABNORMAL LOW (ref 135–145)

## 2024-02-23 LAB — RENAL FUNCTION PANEL
Albumin: 2.2 g/dL — ABNORMAL LOW (ref 3.5–5.0)
Anion gap: 11 (ref 5–15)
BUN: 50 mg/dL — ABNORMAL HIGH (ref 8–23)
CO2: 25 mmol/L (ref 22–32)
Calcium: 7.6 mg/dL — ABNORMAL LOW (ref 8.9–10.3)
Chloride: 96 mmol/L — ABNORMAL LOW (ref 98–111)
Creatinine, Ser: 1.53 mg/dL — ABNORMAL HIGH (ref 0.61–1.24)
GFR, Estimated: 51 mL/min — ABNORMAL LOW (ref >60)
Glucose, Bld: 251 mg/dL — ABNORMAL HIGH (ref 70–99)
Phosphorus: 3.7 mg/dL (ref 2.5–4.6)
Potassium: 3.3 mmol/L — ABNORMAL LOW (ref 3.5–5.1)
Sodium: 132 mmol/L — ABNORMAL LOW (ref 135–145)

## 2024-02-23 LAB — CBC
HCT: 36.2 % — ABNORMAL LOW (ref 39.0–52.0)
Hemoglobin: 12.5 g/dL — ABNORMAL LOW (ref 13.0–17.0)
MCH: 30 pg (ref 26.0–34.0)
MCHC: 34.5 g/dL (ref 30.0–36.0)
MCV: 87 fL (ref 80.0–100.0)
Platelets: 148 K/uL — ABNORMAL LOW (ref 150–400)
RBC: 4.16 MIL/uL — ABNORMAL LOW (ref 4.22–5.81)
RDW: 13.3 % (ref 11.5–15.5)
WBC: 12.2 K/uL — ABNORMAL HIGH (ref 4.0–10.5)
nRBC: 0 % (ref 0.0–0.2)

## 2024-02-23 LAB — GLUCOSE, CAPILLARY
Glucose-Capillary: 100 mg/dL — ABNORMAL HIGH (ref 70–99)
Glucose-Capillary: 101 mg/dL — ABNORMAL HIGH (ref 70–99)
Glucose-Capillary: 103 mg/dL — ABNORMAL HIGH (ref 70–99)
Glucose-Capillary: 117 mg/dL — ABNORMAL HIGH (ref 70–99)
Glucose-Capillary: 47 mg/dL — ABNORMAL LOW (ref 70–99)
Glucose-Capillary: 52 mg/dL — ABNORMAL LOW (ref 70–99)
Glucose-Capillary: 73 mg/dL (ref 70–99)
Glucose-Capillary: 88 mg/dL (ref 70–99)
Glucose-Capillary: 92 mg/dL (ref 70–99)

## 2024-02-23 LAB — MAGNESIUM
Magnesium: 2.2 mg/dL (ref 1.7–2.4)
Magnesium: 2.3 mg/dL (ref 1.7–2.4)

## 2024-02-23 LAB — PHOSPHORUS: Phosphorus: 3.2 mg/dL (ref 2.5–4.6)

## 2024-02-23 LAB — COOXEMETRY PANEL
Carboxyhemoglobin: 1.3 % (ref 0.5–1.5)
Methemoglobin: 0.7 % (ref 0.0–1.5)
O2 Saturation: 65.9 %
Total hemoglobin: 13.2 g/dL (ref 12.0–16.0)

## 2024-02-23 MED ORDER — DEXTROSE 10 % IV SOLN: INTRAVENOUS | Status: AC

## 2024-02-23 MED ORDER — CANGRELOR TETRASODIUM 50 MG IV SOLR
INTRAVENOUS | Status: AC
  Filled 2024-02-23: qty 50

## 2024-02-23 MED ORDER — STERILE WATER FOR INJECTION IJ SOLN
INTRAMUSCULAR | Status: AC
  Administered 2024-02-23: 10 mL
  Filled 2024-02-23: qty 10

## 2024-02-23 MED ORDER — DEXTROSE 10 % IV SOLN: INTRAVENOUS | Status: DC

## 2024-02-23 MED ORDER — FUROSEMIDE 10 MG/ML IJ SOLN
40.0000 mg | Freq: Once | INTRAMUSCULAR | Status: AC
  Administered 2024-02-23: 40 mg via INTRAVENOUS
  Filled 2024-02-23: qty 4

## 2024-02-23 MED ORDER — DEXTROSE 50 % IV SOLN
12.5000 g | INTRAVENOUS | Status: AC
  Administered 2024-02-23: 12.5 g via INTRAVENOUS

## 2024-02-23 MED ORDER — AMIODARONE HCL 200 MG PO TABS
200.0000 mg | ORAL_TABLET | Freq: Two times a day (BID) | ORAL | Status: DC
  Administered 2024-02-23 – 2024-02-29 (×13): 200 mg
  Filled 2024-02-23 (×13): qty 1

## 2024-02-23 MED ORDER — FENTANYL 2500MCG IN NS 250ML (10MCG/ML) PREMIX INFUSION
0.0000 ug/h | INTRAVENOUS | Status: DC
  Administered 2024-02-23: 50 ug/h via INTRAVENOUS
  Filled 2024-02-23: qty 250

## 2024-02-23 MED ORDER — POTASSIUM CHLORIDE 20 MEQ PO PACK
40.0000 meq | PACK | ORAL | Status: AC
  Administered 2024-02-23 (×2): 40 meq
  Filled 2024-02-23 (×2): qty 2

## 2024-02-23 MED ORDER — DEXTROSE 50 % IV SOLN
25.0000 g | INTRAVENOUS | Status: AC
  Administered 2024-02-23: 25 g via INTRAVENOUS

## 2024-02-23 MED ORDER — DEXTROSE 50 % IV SOLN
INTRAVENOUS | Status: AC
  Filled 2024-02-23: qty 50

## 2024-02-23 MED ORDER — BUSPIRONE HCL 10 MG PO TABS
5.0000 mg | ORAL_TABLET | Freq: Once | ORAL | Status: AC
  Administered 2024-02-23: 5 mg
  Filled 2024-02-23: qty 1

## 2024-02-23 NOTE — Plan of Care (Signed)
  Problem: Fluid Volume: Goal: Ability to maintain a balanced intake and output will improve Outcome: Progressing   Problem: Skin Integrity: Goal: Risk for impaired skin integrity will decrease Outcome: Progressing   Problem: Clinical Measurements: Goal: Will remain free from infection Outcome: Progressing   Problem: Coping: Goal: Level of anxiety will decrease Outcome: Progressing   Problem: Nutritional: Goal: Maintenance of adequate nutrition will improve Outcome: Not Progressing

## 2024-02-23 NOTE — Progress Notes (Signed)
 [facility] ADULT ICU REPLACEMENT PROTOCOL   The patient does apply for the [facility] Adult ICU Electrolyte Replacment Protocol based on the criteria listed below:   1.Exclusion criteria: TCTS, ECMO, Dialysis, and Myasthenia Gravis patients 2. Is GFR >/= 30 ml/min? Yes.    Patient's GFR today is 51 3. Is SCr </= 2? Yes.   Patient's SCr is 1.53 mg/dL 4. Did SCr increase >/= 0.5 in 24 hours? No. 5.Pt's weight >40kg  Yes.   6. Abnormal electrolyte(s): POtassium  7. Electrolytes replaced per protocol 8.  Call MD STAT for K+ </= 2.5, Phos </= 1, or Mag </= 1 Physician:  Dr. Rober Medico A Lanson Randle 02/23/2024 5:08 AM

## 2024-02-23 NOTE — Progress Notes (Signed)
 LTM EEG discontinued - no skin breakdown at [facility]. Skin abrasion near F7 noted but not from electrode placement

## 2024-02-23 NOTE — Progress Notes (Signed)
 RT transported pt on ventilator from 2M09 to MRI and back to 2M09 without any issues. RN @ bedside.

## 2024-02-23 NOTE — Progress Notes (Signed)
 vLTM maintenance  All impedances below 10 k  No skin breakdown noted at  [facility]z [facility] P[facility] F4

## 2024-02-23 NOTE — Progress Notes (Signed)
 eLink Physician-Brief Progress Note Patient Name: [name] DOB: 07/23/1960 MRN: [id number]   Date of Service  02/23/2024  HPI/Events of Note  Patient agitated and dyssynchronous on Precedex gtt with PRN Fentanyl and Versed.  eICU Interventions  Fentanyl gtt ordered.        [name] [name] 02/23/2024, 10:41 PM

## 2024-02-23 NOTE — Progress Notes (Signed)
 Called [name]- contact seen in a previous note, [phone].  I called this number-- no answer, unable to leave a message.   A friend of his from a previous homeless shelter he stayed in, the pastor with [facility] [employer] [employer] [facility] [employer] [facility], [name] came to visit. We briefly discussed his care. I asked if he would be willing to make decusions on [name] behalf in the event we continue to not be able to find family, and he agreed. RN set up password for him and he will contact another member of the church he feels like would also want to be involved.   Patrick SHAUNNA Gaskins, DO 02/23/24 8:45 AM [facility]  For contact information, see Amion. If no response to pager, please call PCCM consult pager. After hours, 7PM- 7AM, please call Elink.

## 2024-02-23 NOTE — Progress Notes (Signed)
 Verified with MD and visitor at bedside [name] regarding being main contact person for the patient, unable to get hold of main listed emergency contact, pastor states he has known pt for a while and has not been aware of any pt's family members alive. Patrick provided contacts and password for sharing information, updated by MD on pt's status.

## 2024-02-23 NOTE — Progress Notes (Addendum)
 NEUROLOGY CONSULT FOLLOW UP NOTE   Date of service: February 23, 2024 Patient Name: [name] MRN:  [id number] DOB:  1960-07-08  Interval Hx/subjective   Seen and examined.   Vitals   Vitals:   02/23/24 0403 02/23/24 0500 02/23/24 0600 02/23/24 0747  BP:  99/64 106/73   Pulse:  67    Resp:  18 13   Temp:    98.8 F (37.1 C)  TempSrc:    Axillary  SpO2:  100%    Weight: 84.4 kg     Height:         Body mass index is 23.88 kg/m.   Physical Exam    General: Intubated on no sedation HEENT: Bruise left eyelid. CVS: Regular rate rhythm Respiratory: Vented   Neurologic Examination performed on sedation with propofol at 50    Neuro: Mental Status: Examined on Precedex. Patient does not open eyes to voice or noxious stimuli.  Patient does not follow commands.     Cranial Nerves: His pupils are round and reactive, his gaze is somewhat downward looking which I could not break with head movement.  He has weak corneals bilaterally.  He has a cough.  He has no gag.  He is breathing over the ventilator.   Motor/Sensory: No movement to noxious stimulation in any of the 4 extremities today  Medications  Current Facility-Administered Medications:    acetaminophen  (TYLENOL ) tablet 650 mg, 650 mg, Per Tube, Q4H PRN **OR** acetaminophen  (TYLENOL ) 160 MG/5ML solution 650 mg, 650 mg, Per Tube, Q4H PRN **OR** acetaminophen  (TYLENOL ) suppository 650 mg, 650 mg, Rectal, Q4H PRN, Harold Scholz, MD   amiodarone (NEXTERONE PREMIX) 360-4.14 MG/200ML-% (1.8 mg/mL) IV infusion, 30 mg/hr, Intravenous, Continuous, Tegeler, Lonni PARAS, MD, Last Rate: 16.67 mL/hr at 02/23/24 0700, 30 mg/hr at 02/23/24 0700   aspirin chewable tablet 81 mg, 81 mg, Per Tube, Daily, Harold Scholz, MD, 81 mg at 02/22/24 0916   atorvastatin (LIPITOR) tablet 40 mg, 40 mg, Per Tube, Daily, O'Neal, Darryle Ned, MD, 40 mg at 02/22/24 9083   Chlorhexidine Gluconate Cloth 2 % PADS 6 each, 6 each, Topical, Daily,  Maree Harder, MD, 6 each at 02/22/24 0916   clopidogrel (PLAVIX) tablet 75 mg, 75 mg, Per Tube, Daily, Harold Scholz, MD, 75 mg at 02/22/24 0916   dexmedetomidine (PRECEDEX) 400 MCG/100ML (4 mcg/mL) infusion, 0-1.2 mcg/kg/hr, Intravenous, Titrated, Chand, Scholz, MD, Last Rate: 15.06 mL/hr at 02/23/24 0700, 0.8 mcg/kg/hr at 02/23/24 0700   dextrose 10 % infusion, , Intravenous, Continuous, Rober Gianotti T, MD, Stopped at 02/23/24 0505   dextrose 50 % solution, , , ,    docusate (COLACE) 50 MG/5ML liquid 100 mg, 100 mg, Per Tube, BID PRN, Maree Harder, MD   docusate (COLACE) 50 MG/5ML liquid 100 mg, 100 mg, Per Tube, BID, Paliwal, Aditya, MD, 100 mg at 02/22/24 2208   feeding supplement (PROSource TF20) liquid 60 mL, 60 mL, Per Tube, Daily, Gretta Doffing P, DO, 60 mL at 02/22/24 1624   feeding supplement (VITAL 1.5 CAL) liquid 1,000 mL, 1,000 mL, Per Tube, Continuous, Gretta Doffing SQUIBB, DO, Last Rate: 20 mL/hr at 02/23/24 0700, Infusion Verify at 02/23/24 0700   fentaNYL (SUBLIMAZE) injection 50-200 mcg, 50-200 mcg, Intravenous, Q30 min PRN, Paliwal, Aditya, MD, 50 mcg at 02/22/24 0949   heparin injection 5,000 Units, 5,000 Units, Subcutaneous, Q8H, Gretta Doffing SQUIBB, DO, 5,000 Units at 02/23/24 9375   levETIRAcetam (KEPPRA) undiluted injection 500 mg, 500 mg, Intravenous, Q12H, Judithe Rocky BROCKS, NP,  500 mg at 02/23/24 9375   LORazepam (ATIVAN) injection 2 mg, 2 mg, Intravenous, Once, Khaliqdina, Salman, MD   midazolam PF (VERSED) injection 1-2 mg, 1-2 mg, Intravenous, Q1H PRN, Paliwal, Aditya, MD   norepinephrine (LEVOPHED) 4mg  in (0.016 mg/mL) premix infusion, 0-40 mcg/min, Intravenous, Titrated, Paliwal, Aditya, MD, Last Rate: 7.5 mL/hr at 02/23/24 0700, 2 mcg/min at 02/23/24 0700   ondansetron (ZOFRAN) injection 4 mg, 4 mg, Intravenous, Q6H PRN, Maree Harder, MD   Oral care mouth rinse, 15 mL, Mouth Rinse, Q2H, Maree Harder, MD, 15 mL at 02/23/24 0741   Oral care mouth rinse, 15 mL, Mouth Rinse,  PRN, Maree Harder, MD   pantoprazole (PROTONIX) injection 40 mg, 40 mg, Intravenous, QHS, Maree Harder, MD, 40 mg at 02/22/24 2208   piperacillin-tazobactam (ZOSYN) IVPB 3.375 g, 3.375 g, Intravenous, Q8H, Chand, Valinda, MD, Last Rate: 12.5 mL/hr at 02/23/24 0804, 3.375 g at 02/23/24 0804   polyethylene glycol (MIRALAX / GLYCOLAX) packet 17 g, 17 g, Per Tube, Daily PRN, Maree Harder, MD   polyethylene glycol (MIRALAX / GLYCOLAX) packet 17 g, 17 g, Per Tube, Daily, Paliwal, Aditya, MD, 17 g at 02/21/24 0908   potassium chloride (KLOR-CON) packet 40 mEq, 40 mEq, Per Tube, Q4H, Rober Gianotti T, MD, 40 mEq at 02/23/24 0636   propofol (DIPRIVAN) 1000 MG/100ML infusion, 0-80 mcg/kg/min, Intravenous, Continuous, Chand, Valinda, MD, Stopped at 02/21/24 1450   sodium chloride flush (NS) 0.9 % injection 10-40 mL, 10-40 mL, Intracatheter, Q12H, Maree Harder, MD, 10 mL at 02/22/24 2215   sodium chloride flush (NS) 0.9 % injection 10-40 mL, 10-40 mL, Intracatheter, PRN, Maree Harder, MD   thiamine (VITAMIN B1) tablet 100 mg, 100 mg, Per Tube, Daily, Gretta Doffing P, DO, 100 mg at 02/22/24 1716   vasopressin (PITRESSIN) 20 Units in 100 mL (0.2 unit/mL) infusion-*FOR SHOCK*, 0-0.03 Units/min, Intravenous, Continuous, Harold Valinda, MD, Stopped at 02/21/24 0641  Labs and Diagnostic Imaging   CBC:  Recent Labs  Lab 02/22/24 1004 02/23/24 0400  WBC 11.7* 12.2*  HGB 14.2 12.5*  HCT 40.9 36.2*  MCV 86.3 87.0  PLT 146* 148*    Basic Metabolic Panel:  Lab Results  Component Value Date   NA 132 (L) 02/23/2024   K 3.3 (L) 02/23/2024   CO2 25 02/23/2024   GLUCOSE 251 (H) 02/23/2024   BUN 50 (H) 02/23/2024   CREATININE 1.53 (H) 02/23/2024   CALCIUM 7.6 (L) 02/23/2024   GFRNONAA 51 (L) 02/23/2024   GFRAA  02/27/2009    >60        The eGFR has been calculated using the MDRD equation. This calculation has not been validated in all clinical situations. eGFR's persistently <60 mL/min signify possible  Chronic Kidney Disease.   Lipid Panel:  Lab Results  Component Value Date   LDLCALC 58 04/05/2009   HgbA1c:  Lab Results  Component Value Date   HGBA1C 5.1 02/19/2024   Urine Drug Screen:     Component Value Date/Time   LABOPIA NONE DETECTED 02/20/2024 0430   COCAINSCRNUR NONE DETECTED 02/20/2024 0430   LABBENZ NONE DETECTED 02/20/2024 0430   AMPHETMU NONE DETECTED 02/20/2024 0430   THCU NONE DETECTED 02/20/2024 0430   LABBARB NONE DETECTED 02/20/2024 0430    Alcohol Level     Component Value Date/Time   [facility] <15 02/19/2024 1751   INR  Lab Results  Component Value Date   INR 1.5 (H) 02/19/2024   APTT  Lab Results  Component Value Date  APTT (H) 02/23/2009    40        IF BASELINE aPTT IS ELEVATED, SUGGEST PATIENT RISK ASSESSMENT BE USED TO DETERMINE APPROPRIATE ANTICOAGULANT THERAPY.   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 But no cerebellar edema. And Etiology and significance of this finding is unclear. Recommend follow up with Neurosurgery. No acute intracranial hemorrhage or subdural blood. No acute intracranial abnormality. Severe chronic paranasal sinus disease.   CT Head without contrast (Personally reviewed): Moderate motion artifact limitations. High-density along the superior falx consistent with dural calcifications was present on prior exam, some of this appears slightly thicker than prior. This is favored to represent dural calcifications exacerbated by motion, however a small amount of subdural hemorrhage is difficult to exclude.  Expansile soft tissue lesion in the left maxillary sinus which erodes the anterior, medial, and posterolateral walls was present on prior exam but increased in size from prior.    Neurodiagnostics LTM EEG 02/20/2024 1015 to 02/21/2024 1000:  At the  beginning of the study, EEG showed evidence of epileptogenicity with generalized onset. This eeg pattern is on the ictal-interictal continuum with high suspicion for ictal nature. Gradually as medication were adjusted, EEG showed diffuse cerebral dysfunction (encephalopathy).    LTM EEG 02/21/2024 to 02/22/2024: Generalized cerebral dysfunction: No seizures or epileptiform discharges.  Event button pressed on 02/21/2024 for eyelid twitching without concomitant EEG change  LTM EEG 02/22/2024 to 02/23/2024-diffuse encephalopathy.  Assessment   ILIR MAHRT is a 63 y.o. male with hx of CAD status post PCI 2010 for anterior STEMI with subsequent CABG x 3, ischemic cardiomyopathy (EF 25 to 30%), CHF R EF who was found down 10/25 with unknown downtime.  CPR initiated 1655, noted to be in V-fib arrest.  Per EMS, shocked approximately 12 times.     On exam, patient is comatose, with intact brainstem reflexes but no purposeful response on exam.  Long-term EEG unremarkable for seizures.   Concern for significant Anoxic Brain Injury secondary to Cardiac Arrest with unknown downtime.  Lack of purposeful response even 72 hours after cardiac arrest portends poor prognosis in terms of neurologically meaningful recovery.  Recommendations  - Discontinue LTM EEG - Continue maintenance Keppra 500mg  BID. Will increase if he has more seizures. - MRI today to evaluate for hypoxic/anoxic brain injury - May need to involve ethics since there is no family available and care might be futile going forward  Plan discussed with Dr. Gretta ______________________________________________________________________     -- Eligio Lav, MD Neurologist [facility]  CRITICAL CARE ATTESTATION Performed by: Eligio Lav, MD Total critical care time: 31 minutes Critical care time was exclusive of separately billable procedures and treating other patients and/or supervising APPs/Residents/Students Critical  care was necessary to treat or prevent imminent or life-threatening deterioration. This patient is critically ill and at significant risk for neurological worsening and/or death and care requires constant monitoring. Critical care was time spent personally by me on the following activities: development of treatment plan with patient and/or surrogate as well as nursing, discussions with consultants, evaluation of patient's response to treatment, examination of patient, obtaining history from patient or surrogate, ordering and performing treatments and interventions, ordering and review of laboratory studies, ordering and review of radiographic studies, pulse oximetry, re-evaluation of patient's condition, participation in multidisciplinary rounds and medical decision making of high complexity in the care of  this patient.    Addendum MRI brain completed-reviewed personally.  MRI brain indicative of severe anoxic brain injury. At this time, I would recommend having withdrawal of care conversation since any further care at this time would be futile with minimal to no chances of neurologic meaningful recovery.  Plan discussed with Dr. Gretta.  -- Eligio Lav, MD Neurologist [facility]  Additional 15 minutes CC time

## 2024-02-23 NOTE — TOC CM/SW Note (Addendum)
 Transition of Care [facility]) - Inpatient Brief Assessment   Patient Details  Name: [name] MRN: [id number] Date of Birth: 05-Feb-1961  Transition of Care [facility]) CM/SW Contact:    [name], LCSWA Phone Number: 02/23/2024, 1:22 PM   Clinical Narrative:  1:22 PM Per progressions, contact information for Isiaih Hollenbach is in valid. CSW also attempted to contact Ronal Ada, but the phone number was invalid. CSW relayed information to [facility] [employer] and TOC Leadership. CSW contacted [name] of [employer] and is awaiting a response. MD during progressions informed CSW that patient's pastor was agreeable with being medical decision maker for patient. CSW relayed information to [facility] and requested guidance. CSW will continue to follow.  3:11 PM CSW spoke with [name] of [employer] who informed CSW that patient has a sister and brother but unsure of their names and contact information. CSW deferred MD to Advanced Care Planning policy v. 10, as well as ethics and legal for further guidance if they felt they need to consult. CSW made APS report to obtain medical decision makers as patient lacks capacity and contact information of family is currently unknown. CSW will continue to follow.  4:08 PM CSW was informed that patient's APS report was approved and an APS social worker would be assigned to the case. CSW will continue to follow.   Transition of Care Asessment: Insurance and Status: Insurance coverage has been reviewed Patient has primary care physician: Yes Home environment has been reviewed: N/A Prior level of function:: N/A Prior/Current Home Services: No current home services Social Drivers of Health Review:  (Patient is currently intubated) Readmission risk has been reviewed: Yes (Currently Yellow 17%) Transition of care needs: transition of care needs identified, TOC will continue to follow

## 2024-02-23 NOTE — Procedures (Addendum)
 Patient Name: [name]  MRN: [id number]  Epilepsy Attending: Arlin MALVA Krebs  Referring Physician/Provider: Harold Scholz, MD  Duration: 02/22/2024 1015 to 02/23/2024 1010   Patient history: 62yo M s/p cardiac arrest. EEG to evaluate for seizure   Level of alertness: comatose   AEDs during EEG study: LEV   Technical aspects: This EEG study was done with scalp electrodes positioned according to the 10-20 International system of electrode placement. Electrical activity was reviewed with band pass filter of 1-70Hz , sensitivity of 7 uV/mm, display speed of 21mm/sec with a 60Hz  notched filter applied as appropriate. EEG data were recorded continuously and digitally stored.  Video monitoring was available and reviewed as appropriate.   Description: EEG showed near continuous generalized low amplitude 3-5z theta-delta slowing. Hyperventilation and photic stimulation were not performed.      ABNORMALITY - Continuous slow, generalized   IMPRESSION: This study was suggestive of generalized  cerebral dysfunction (encephalopathy). No seizures or definite epileptiform discharges were noted.   Beya Tipps O Anay Walter

## 2024-02-23 NOTE — Progress Notes (Signed)
 NAME:  [name], MRN:  [id number], DOB:  24-Nov-1960, LOS: 4 ADMISSION DATE:  02/19/2024, CONSULTATION DATE:  02/19/2024 REFERRING MD:  Patrick Sakai, MD, CHIEF COMPLAINT:  cardiac arrest  History of Present Illness:  63 y/o male with PMH for CAD who had a PCI in 2010 for anterior STEMI and found to have 3 vessel disease and went for CABG in 2010 (LIMA-LAD, SVG-Ramus, SVG -OM, SVG-RPDA), HFrEF, Ischemic Cardiomyopathy-last echo 2011 showing EF 25-30%, with akinesis to dyskinesis of the entire anterior, anteroseptal, and apical myocardium, and cocaine use disorder who was found down outside outside of [facility] with unknown down time and CPR initiated 1655.  Patient noted to be in V fib arrest.  He was shocked approximately 12 times by EMS, IO placed and 450mg  Amiodarone, 6mg  Epinephrine, 1 liter IVF, 2gm Magnesium.  In ED patient on Levophed drip at 15mcg and Amiodarone drip.  He was intubated and sedated.  Code STEMI called, ST elevations in anteriolat leads and I spoke with Dr End, there is a potential ICH and so there is hesitation to take patient to cath lab given Heparin during cath. LA 9.3, Cr 1.30, WBC 13.8, INR 1.5, Glucose 303.  CT head also showing a soft tissue mass in the left maxillary sinus. Pertinent  Medical History  CAD who had a PCI in 2010 for anterior STEMI and found to have 3 vessel disease and went for CABG in 2010 (LIMA-LAD, SVG-Ramus, SVG -OM, SVG-RPDA), HFrEF, Ischemic Cardiomyopahty-last echo 2011 showing EF 25-30%, with akinesis to dyskinesis of the entire anterior, anteroseptal, and apical myocardium, and cocaine use disorder   Significant Hospital Events: Including procedures, antibiotic start and stop dates in addition to other pertinent events   10/24: Admit to ICU 10.28 MRI with anoxic brain injury  Interim History / Subjective:  Overnight no acute events. Remains on cEEG.   Objective    Blood pressure 106/73, pulse 67, temperature  98.8 F (37.1 C), temperature source Axillary, resp. rate 13, height 6' 2.02 (1.88 m), weight 84.4 kg, SpO2 100%.    Vent Mode: PRVC FiO2 (%):  [40 %] 40 % Set Rate:  [18 bmp] 18 bmp Vt Set:  [650 mL] 650 mL PEEP:  [5 cmH20] 5 cmH20 Plateau Pressure:  [13 cmH20-14 cmH20] 13 cmH20   Intake/Output Summary (Last 24 hours) at 02/23/2024 0801 Last data filed at 02/23/2024 0700 Gross per 24 hour  Intake 1764.93 ml  Output 2400 ml  Net -635.07 ml   Filed Weights   02/21/24 0500 02/22/24 0500 02/23/24 0403  Weight: 75.3 kg 79.4 kg 84.4 kg    Examination: General: critically ill appearing man lying in bed in NAD HEENT: staples R forehead, ecchymoses on R side of face Neuro: examined after precedex d/c. Pupils reactive, downward gaze. No gag, + cough. Not withdrawing from pain.  Chest: CTAB, synchronous with MV Heart: S1S2, RRR Abdomen: soft, NT   Resp culture: abdundant staph aureus, citrobacter, K pneumoniae> sensitivities pending  Resolved problem list  Acute high anion gap metabolic acidosis/lactic acidosis Lactic acidosis hypokalemia  Assessment and Plan  Acute anterio-lateral STEMI, S/p out-of-hospital V-fib cardiac arrest Acute on chronic HFrEF due to ischemic cardiomyopathy with cardiogenic shock New onset paroxysmal Afib -DAPt, hold metoprolol -amiodarone> transition to enteral -heparin for Afib -weaned off NE; can restart if MAP <65 -monitor electrolytes and replete as needed -lasix today given net positive volume status  Acute respiratory failure with hypoxia on MV Probable aspiration pneumonia-- polymicrobial -  LTVV -VAP prevention protocol -PAD protocol for sedation -7 days of zosyn, waiting on sensitivities -mental status precludes extubation unless for palliative intent  Acute encephalopathy, likely anoxic brain injury Myoclonic seizures -appreciate neurology's management -brain MRI today -need to locate medical decision maker  Hypoglycemia -go up  on TF; preferable than using dextrose given higher risk of refeeding syndrome with this  -titrate off d10w  Hypokalemia -repleted -recheck this afternoon  Acute kidney injury due to ischemic ATN postcardiac arrest, worsening -strict I/O -renally dose meds, avoid nephrotoxic meds -monitor  Hypocalcemia -monitor  Chronic subdural hygroma.  Subdural hemorrhage was ruled out Left maxillary sinus mass -OP follow up  Forehead ecchymoses, abrasions -supportive care  Thrombocytopenia, possibly due to antibiotics -monitor; ok to stay on heparin  See previous note-- updated pastor he knew from a homeless shelter he had been in that was affiliated with a church. He does not know of family for [name] but would be willing to be a surrogate decision maker if we are unable to find one.   Labs   CBC: Recent Labs  Lab 02/19/24 1751 02/19/24 1801 02/19/24 2330 02/20/24 0550 02/20/24 0852 02/21/24 0212 02/21/24 1057 02/22/24 1004 02/23/24 0400  WBC 13.8*  --  22.8*  --   --  23.6*  --  11.7* 12.2*  HGB 15.0   < > 17.8*   < > 17.3* 17.0 15.3 14.2 12.5*  HCT 47.7   < > 54.7*   < > 51.0 51.5 45.0 40.9 36.2*  MCV 97.5  --  92.1  --   --  88.8  --  86.3 87.0  PLT 233  --  264  --   --  220  --  146* 148*   < > = values in this interval not displayed.    Basic Metabolic Panel: Recent Labs  Lab 02/19/24 2330 02/20/24 0550 02/21/24 0212 02/21/24 1057 02/22/24 0415 02/22/24 0800 02/22/24 1700 02/23/24 0400  NA 138   < > 136 132* 138 135  --  132*  K 3.5   < > 4.7 4.1 2.4* 3.8  --  3.3*  CL 105  --  96*  --  104 98  --  96*  CO2 18*  --  23  --  19* 23  --  25  GLUCOSE 135*  --  158*  --  169* 138*  --  251*  BUN 26*  --  44*  --  44* 52*  --  50*  CREATININE 1.65*  --  2.81*  --  1.89* 2.28*  --  1.53*  CALCIUM 8.1*  --  8.5*  --  5.5* 8.0*  --  7.6*  MG 2.6*  --   --   --   --  2.1 2.0 2.2  PHOS 3.9  --  6.5*  --  5.0*  --  4.5 3.7   < > = values in this interval not  displayed.      This patient is critically ill with multiple organ system failure which requires frequent high complexity decision making, assessment, support, evaluation, and titration of therapies. This was completed through the application of advanced monitoring technologies and extensive interpretation of multiple databases. During this encounter critical care time was devoted to patient care services described in this note for 38 minutes.  Leita SHAUNNA Gaskins, DO 02/23/24 3:05 PM [facility]  For contact information, see Amion. If no response to pager, please call PCCM consult pager.  After hours, 7PM- 7AM, please call Elink.

## 2024-02-23 NOTE — Progress Notes (Addendum)
 eLink Physician-Brief Progress Note Patient Name: [name] DOB: 1960-11-09 MRN: [id number]   Date of Service  02/23/2024  HPI/Events of Note  1.) Patient is on post cardiac arrest temperature management on the arctic sun-> has been shivering-> initially tried counter warming measures- but has started shivering again. Requesting Buspar to help with the shivering. - Buspar ordered.   2.) CBG's have been low. RN has had to give dextrose once tonight for a CBG or 44, and now it is down to 74 again. Currently on very slow to advance TF ( 20/hr- goal to increase by 10 every 24 hours). Requesting a gtt to keep sugars from dropping until he reaches goal.   eICU Interventions  -start D10 at 10 ml/hr. Call back if low.   Has MRI at 5 AM is due to do  Discussed with RN.      Intervention Category Intermediate Interventions: Other:  Jodelle ONEIDA Hutching 02/23/2024, 12:42 AM  0407 Glucose < 50. Going to MRI at 5 AM -discussed with RN. Increased D10 to 30 ml/hr, hrly POC glucose.

## 2024-02-24 DIAGNOSIS — J9601 Acute respiratory failure with hypoxia: Secondary | ICD-10-CM | POA: Diagnosis not present

## 2024-02-24 DIAGNOSIS — I469 Cardiac arrest, cause unspecified: Secondary | ICD-10-CM | POA: Diagnosis not present

## 2024-02-24 DIAGNOSIS — Z515 Encounter for palliative care: Secondary | ICD-10-CM | POA: Diagnosis not present

## 2024-02-24 DIAGNOSIS — Z7189 Other specified counseling: Secondary | ICD-10-CM

## 2024-02-24 DIAGNOSIS — G931 Anoxic brain damage, not elsewhere classified: Secondary | ICD-10-CM | POA: Diagnosis not present

## 2024-02-24 DIAGNOSIS — I480 Paroxysmal atrial fibrillation: Secondary | ICD-10-CM | POA: Diagnosis not present

## 2024-02-24 LAB — CBC
HCT: 38.1 % — ABNORMAL LOW (ref 39.0–52.0)
Hemoglobin: 13 g/dL (ref 13.0–17.0)
MCH: 30 pg (ref 26.0–34.0)
MCHC: 34.1 g/dL (ref 30.0–36.0)
MCV: 87.8 fL (ref 80.0–100.0)
Platelets: 145 K/uL — ABNORMAL LOW (ref 150–400)
RBC: 4.34 MIL/uL (ref 4.22–5.81)
RDW: 13.3 % (ref 11.5–15.5)
WBC: 13.9 K/uL — ABNORMAL HIGH (ref 4.0–10.5)
nRBC: 0 % (ref 0.0–0.2)

## 2024-02-24 LAB — RENAL FUNCTION PANEL
Albumin: 2.4 g/dL — ABNORMAL LOW (ref 3.5–5.0)
Anion gap: 9 (ref 5–15)
BUN: 45 mg/dL — ABNORMAL HIGH (ref 8–23)
CO2: 28 mmol/L (ref 22–32)
Calcium: 7.8 mg/dL — ABNORMAL LOW (ref 8.9–10.3)
Chloride: 97 mmol/L — ABNORMAL LOW (ref 98–111)
Creatinine, Ser: 1.21 mg/dL (ref 0.61–1.24)
GFR, Estimated: >60 mL/min (ref >60)
Glucose, Bld: 122 mg/dL — ABNORMAL HIGH (ref 70–99)
Phosphorus: 3 mg/dL (ref 2.5–4.6)
Potassium: 4.3 mmol/L (ref 3.5–5.1)
Sodium: 134 mmol/L — ABNORMAL LOW (ref 135–145)

## 2024-02-24 LAB — CULTURE, RESPIRATORY W GRAM STAIN

## 2024-02-24 LAB — GLUCOSE, CAPILLARY
Glucose-Capillary: 86 mg/dL (ref 70–99)
Glucose-Capillary: 82 mg/dL (ref 70–99)
Glucose-Capillary: 111 mg/dL — ABNORMAL HIGH (ref 70–99)
Glucose-Capillary: 133 mg/dL — ABNORMAL HIGH (ref 70–99)
Glucose-Capillary: 140 mg/dL — ABNORMAL HIGH (ref 70–99)
Glucose-Capillary: 128 mg/dL — ABNORMAL HIGH (ref 70–99)

## 2024-02-24 LAB — PHOSPHORUS: Phosphorus: 2.9 mg/dL (ref 2.5–4.6)

## 2024-02-24 LAB — MAGNESIUM: Magnesium: 2.3 mg/dL (ref 1.7–2.4)

## 2024-02-24 MED ORDER — MEROPENEM 1 G IV SOLR
1.0000 g | Freq: Three times a day (TID) | INTRAVENOUS | Status: AC
  Administered 2024-02-24 – 2024-02-27 (×11): 1 g via INTRAVENOUS
  Filled 2024-02-24 (×11): qty 20

## 2024-02-24 MED ORDER — FUROSEMIDE 10 MG/ML IJ SOLN
40.0000 mg | Freq: Once | INTRAMUSCULAR | Status: AC
  Administered 2024-02-24: 40 mg via INTRAVENOUS
  Filled 2024-02-24: qty 4

## 2024-02-24 NOTE — Progress Notes (Signed)
 Received a call from a person named  Raphael Myron, who identified herself as the Librarian, academic of [employer]. She stated that pt is known to the company for years was able to reach the patient's sister, Connee Moles.  According to Raphael Myron, the sister lives approximately three hours away and is scheduled for a procedure on Monday. Elink was notified with the information.  Sister's  and the director's contact information provided below   Sister's name is [employer] Phone number [phone]    Physiological scientist of [place] [employer]-  [employer] Telephone number [phone]

## 2024-02-24 NOTE — Plan of Care (Signed)
  Problem: Fluid Volume: Goal: Ability to maintain a balanced intake and output will improve Outcome: Progressing   Problem: Nutritional: Goal: Maintenance of adequate nutrition will improve Outcome: Progressing   Problem: Skin Integrity: Goal: Risk for impaired skin integrity will decrease Outcome: Progressing   Problem: Tissue Perfusion: Goal: Adequacy of tissue perfusion will improve Outcome: Progressing   Problem: Clinical Measurements: Goal: Will remain free from infection Outcome: Progressing Goal: Cardiovascular complication will be avoided Outcome: Progressing

## 2024-02-24 NOTE — Progress Notes (Signed)
 APS representative at bedside, questions answered by me and Dr. Voncile Domino from case management notified and contact given to her. APS provided with pastor Marshall's contacts. APS information saved. APS SW [name] [phone]

## 2024-02-24 NOTE — TOC CM/SW Note (Addendum)
 Transition of Care [facility]) - Inpatient Brief Assessment   Patient Details  Name: [name] MRN: [id number] Date of Birth: 13-Apr-1961  Transition of Care [facility]) CM/SW Contact:    [name], LCSWA Phone Number: 02/24/2024, 1:58 PM   Clinical Narrative:  1:59 PM CSW attempted to contact assigned detective to patient's case, Marsa Neighbor [phone]; [email or web address]). CSW was informed by office representative that Neighbor out for the day. CSW left a message and [employer]. TOC Leadership informed CSW of deferment to surrogate medical decision maker while APS is doing what they need and for now follow PMT unless told otherwise by APS. CSW relayed information to DO. CSW spoke with [employer] representative who informed CSW that sole contact on patient's chart for them is the patient's pastor, Layman. CSW will continue to follow.  2:51 PM CSW attempted to speak with patient's APS social worker, [employer] [phone]). There was no response and a voicemail was left.   Transition of Care Asessment: Insurance and Status: Insurance coverage has been reviewed Patient has primary care physician: Yes Home environment has been reviewed: N/A Prior level of function:: N/A Prior/Current Home Services: No current home services Social Drivers of Health Review:  (Patient is currently intubated) Readmission risk has been reviewed: Yes (Currently Yellow 17%) Transition of care needs: transition of care needs identified, TOC will continue to follow

## 2024-02-24 NOTE — Consult Note (Signed)
 Consultation Note Date: 02/24/2024   Patient Name: [name]  DOB: [birth year]  MRN: [id number]  Age / Sex: 63 y.o., male  PCP: Scarlett Ronal Caldron, NP Referring Physician: Gretta Leita SQUIBB, DO  Reason for Consultation: GOC and assistance identifying surrogate decision maker  HPI/Patient Profile: 63 y.o. male  with past medical history of CAD s/p PCI 2010 for anterior STEMI, 3 vessel disease s/p CABG 2010, HFrEF 25-30% 2011, ICM, cocaine use admitted on 02/19/2024 with unknown downtime found in Vfib arrest. MRI consistent with anoxic brain injury. Prognosis is poor.   Clinical Assessment and Goals of Care: Consult received and chart review completed. I met at Mr. Sultana's bedside. Discussed with RN. RN reports pastor [name] has been by today. RN reports pastor has known Mr. Beckel since 2011 and has no information regarding any family. Juliene Layman is also listed as representative for Mr. Moncada at [facility]. Juliene has spoken with Dr. Gretta and is willing to act as surrogate decision maker on Mr. Havens behalf. Juliene seems to be an appropriate decision maker in the absence of family being located (or if family unwilling to speak on Mr. Beckers behalf). Will give APS some more time to attempt to locate any family.   Noted APS representative to bedside and assisting to help with locating any family. I will give them time to investigate. I will touch base with them tomorrow to follow up on progress. If no progress tomorrow I believe it will be appropriate to begin goals of care discussions with pastor.   Overall prognosis is poor. There will be significant decisions to be made. Agree with continued efforts to locate family for decision making as well as provide family with opportunity to visit if desired as I fear we are approaching end of life.   Primary Decision Maker OTHER Juliene [name] in  the absence of any family located    SUMMARY OF RECOMMENDATIONS   Will follow up tomorrow on progress of any family located. If no progress made conversations with pastor should begin regarding goals of care.   Code Status/Advance Care Planning: Full code   Symptom Management:  Per PCCM  Prognosis:  Very poor.   Discharge Planning: To Be Determined      Primary Diagnoses: Present on Admission:  Cardiac arrest [facility])   I have reviewed the medical record, interviewed the patient and family, and examined the patient. The following aspects are pertinent.  Past Medical History:  Diagnosis Date   Heart disease    Social History   Socioeconomic History   Marital status: Single    Spouse name: Not on file   Number of children: Not on file   Years of education: Not on file   Highest education level: Not on file  Occupational History   Not on file  Tobacco Use   Smoking status: Every Day   Smokeless tobacco: Not on file  Substance and Sexual Activity   Alcohol use: No   Drug use: Yes  Comment: crack   Sexual activity: Not on file  Other Topics Concern   Not on file  Social History Narrative   Not on file   Social Drivers of Health   Financial Resource Strain: Not on file  Food Insecurity: Food Insecurity Present (01/30/2024)   Hunger Vital Sign    Worried About Running Out of Food in the Last Year: Sometimes true    Ran Out of Food in the Last Year: Sometimes true  Transportation Needs: Patient Declined (01/30/2024)   PRAPARE - Administrator, Civil Service (Medical): Patient declined    Lack of Transportation (Non-Medical): Patient declined  Physical Activity: Not on file  Stress: Not on file  Social Connections: Not on file   No family history on file. Scheduled Meds:  amiodarone  200 mg Per Tube BID   aspirin  81 mg Per Tube Daily   atorvastatin  40 mg Per Tube Daily   Chlorhexidine Gluconate Cloth  6 each Topical Daily   clopidogrel  75  mg Per Tube Daily   docusate  100 mg Per Tube BID   feeding supplement (PROSource TF20)  60 mL Per Tube Daily   heparin injection (subcutaneous)  5,000 Units Subcutaneous Q8H   levETIRAcetam  500 mg Intravenous Q12H   mouth rinse  15 mL Mouth Rinse Q2H   pantoprazole (PROTONIX) IV  40 mg Intravenous QHS   polyethylene glycol  17 g Per Tube Daily   sodium chloride flush  10-40 mL Intracatheter Q12H   thiamine  100 mg Per Tube Daily   Continuous Infusions:  dexmedetomidine (PRECEDEX) IV infusion 1 mcg/kg/hr (02/24/24 1300)   feeding supplement (VITAL 1.5 CAL) 50 mL/hr at 02/24/24 1300   fentaNYL infusion INTRAVENOUS Stopped (02/24/24 0923)   meropenem (MERREM) IV 1 g (02/24/24 1347)   norepinephrine (LEVOPHED) Adult infusion Stopped (02/23/24 0933)   PRN Meds:.acetaminophen  **OR** acetaminophen  (TYLENOL ) oral liquid 160 mg/5 mL **OR** acetaminophen , docusate, fentaNYL (SUBLIMAZE) injection, midazolam PF, ondansetron (ZOFRAN) IV, mouth rinse, polyethylene glycol, sodium chloride flush No Known Allergies Review of Systems  Unable to perform ROS: Acuity of condition    Physical Exam Vitals and nursing note reviewed.  Constitutional:      General: He is not in acute distress.    Appearance: He is ill-appearing.     Interventions: He is intubated.  Cardiovascular:     Rate and Rhythm: Normal rate.  Pulmonary:     Effort: No tachypnea or accessory muscle usage. He is intubated.  Abdominal:     Palpations: Abdomen is soft.  Neurological:     Mental Status: He is unresponsive.     Comments: Decerebrate posturing; not reacting to painful stimuli     Vital Signs: BP (!) 130/54   Pulse 85   Temp 99 F (37.2 C)   Resp 17   Ht 6' 2.02 (1.88 m)   Wt 81.8 kg   SpO2 100%   BMI 23.14 kg/m  Pain Scale: CPOT       SpO2: SpO2: 100 % O2 Device:SpO2: 100 % O2 Flow Rate: .   IO: Intake/output summary:  Intake/Output Summary (Last 24 hours) at 02/24/2024 1400 Last data filed at  02/24/2024 1300 Gross per 24 hour  Intake 2473.75 ml  Output 3875 ml  Net -1401.25 ml    LBM: Last BM Date : 02/23/24 Baseline Weight: Weight: 70 kg Most recent weight: Weight: 81.8 kg       Time Total: 55 min  Greater  than 50%  of this time was spent counseling and coordinating care related to the above assessment and plan.  Signed by: [name], NP Palliative Medicine Team Pager # [phone] (M-F 8a-5p) Team Phone # [phone] (Nights/Weekends)

## 2024-02-24 NOTE — Progress Notes (Signed)
 Juliene Na at bedside visiting pt, pastor briefly updated on pt's status and test results by MD, still unable to locate any known family members, remains pt's decision maker.

## 2024-02-24 NOTE — Progress Notes (Addendum)
 NAME:  [name], MRN:  [id number], DOB:  February 10, 1961, LOS: 5 ADMISSION DATE:  02/19/2024, CONSULTATION DATE:  02/19/2024 REFERRING MD:  Lonni Sakai, MD, CHIEF COMPLAINT:  cardiac arrest  History of Present Illness:  63 y/o male with PMH for CAD who had a PCI in 2010 for anterior STEMI and found to have 3 vessel disease and went for CABG in 2010 (LIMA-LAD, SVG-Ramus, SVG -OM, SVG-RPDA), HFrEF, Ischemic Cardiomyopathy-last echo 2011 showing EF 25-30%, with akinesis to dyskinesis of the entire anterior, anteroseptal, and apical myocardium, and cocaine use disorder who [facility] with unknown down time and CPR initiated 1655.  Patient noted to be in V fib arrest.  He was shocked approximately 12 times by EMS, IO placed and 450mg  Amiodarone, 6mg  Epinephrine, 1 liter IVF, 2gm Magnesium.  In ED patient on Levophed drip at 15mcg and Amiodarone drip.  He was intubated and sedated.  Code STEMI called, ST elevations in anteriolat leads and I spoke with Dr End, there is a potential ICH and so there is hesitation to take patient to cath lab given Heparin during cath. LA 9.3, Cr 1.30, WBC 13.8, INR 1.5, Glucose 303.  CT head also showing a soft tissue mass in the left maxillary sinus. Pertinent  Medical History  CAD who had a PCI in 2010 for anterior STEMI and found to have 3 vessel disease and went for CABG in 2010 (LIMA-LAD, SVG-Ramus, SVG -OM, SVG-RPDA), HFrEF, Ischemic Cardiomyopahty-last echo 2011 showing EF 25-30%, with akinesis to dyskinesis of the entire anterior, anteroseptal, and apical myocardium, and cocaine use disorder   Significant Hospital Events: Including procedures, antibiotic start and stop dates in addition to other pertinent events   10/24: Admit to ICU 10.28 MRI with anoxic brain injury  Interim History / Subjective:  Overnight fentanyl infusion added for vent dyssynchrony. Remains on precedex.  Objective    Blood  pressure 106/67, pulse 80, temperature 99 F (37.2 C), resp. rate 17, height 6' 2.02 (1.88 m), weight 81.8 kg, SpO2 98%.    Vent Mode: CPAP;PSV FiO2 (%):  [40 %] 40 % Set Rate:  [18 bmp] 18 bmp Vt Set:  [650 mL] 650 mL PEEP:  [5 cmH20] 5 cmH20 Pressure Support:  [5 cmH20] 5 cmH20 Plateau Pressure:  [14 cmH20] 14 cmH20   Intake/Output Summary (Last 24 hours) at 02/24/2024 1309 Last data filed at 02/24/2024 1100 Gross per 24 hour  Intake 2359.24 ml  Output 4775 ml  Net -2415.76 ml   Filed Weights   02/22/24 0500 02/23/24 0403 02/24/24 0500  Weight: 79.4 kg 84.4 kg 81.8 kg    Examination: General: critically ill appearing man lying in bed in NAD HEENT:  Neuro: examined after precedex d/c. Pupils reactive, downward gaze. No gag, + cough. Not withdrawing from pain.  Chest: CTAB, synchronous with MV Heart: S1S2, RRR Abdomen: soft, NT   Resp culture: abdundant staph aureus, citrobacter, K pneumoniae  Brain MRI: Severe diffuse anoxic brain injury  Resolved problem list  Acute high anion gap metabolic acidosis/lactic acidosis Lactic acidosis hypokalemia  Assessment and Plan  Acute anterio-lateral STEMI, S/p out-of-hospital V-fib cardiac arrest Acute on chronic HFrEF due to ischemic cardiomyopathy with cardiogenic shock New onset paroxysmal Afib - Continue holding metoprolol, continue dual antiplatelet therapy - Continue enteral amiodarone -Continue heparin for atrial fibrillation -Monitor electrolytes and replete as needed -Daily lasix  Acute respiratory failure with hypoxia on MV Probable aspiration pneumonia-- polymicrobial -LTVV -VAP prevention  protocol - Per protocol for sedation -Daily SAT and SBT as appropriate-with mental status brain MRI findings, no role for extubation unless for palliative intent. -change ceftriaxone to meropenem per ID recs  Acute encephalopathy, anoxic brain injury confirmed on MRI Myoclonic seizures -Appreciate neurology's  management -Still working on locating family members for goals of care discussions moving forward.  Have a pastor who has known him for homeless shelter in the past.  APS has been notified of situation to help with family contacts. Palliative care consulted to help with decision making as well.   Hypoglycemia -Discontinue D10W, monitor off dextrose - Continue tube feeds  Hypokalemia, resolved -Monitor  Acute kidney injury due to ischemic ATN postcardiac arrest, resolved -Strict I's/O - Renally dose meds and avoid nephrotoxic meds - Continue to monitor  Hypocalcemia - Monitor, tube feeds  Chronic subdural hygroma.  Subdural hemorrhage was ruled out Left maxillary sinus mass -OP follow up  Forehead ecchymoses, abrasions -Supportive care -Can take staples out after 7 to 10 days  Thrombocytopenia, possibly due to antibiotics -Continue to monitor but okay to stay on heparin   Juliene [name] at bedside today, updated on MRI findings.   Labs   CBC: Recent Labs  Lab 02/19/24 2330 02/20/24 0550 02/21/24 0212 02/21/24 1057 02/22/24 1004 02/23/24 0400 02/24/24 0231  WBC 22.8*  --  23.6*  --  11.7* 12.2* 13.9*  HGB 17.8*   < > 17.0 15.3 14.2 12.5* 13.0  HCT 54.7*   < > 51.5 45.0 40.9 36.2* 38.1*  MCV 92.1  --  88.8  --  86.3 87.0 87.8  PLT 264  --  220  --  146* 148* 145*   < > = values in this interval not displayed.    Basic Metabolic Panel: Recent Labs  Lab 02/22/24 0415 02/22/24 0800 02/22/24 1700 02/23/24 0400 02/23/24 1620 02/24/24 0231  NA 138 135  --  132* 134* 134*  K 2.4* 3.8  --  3.3* 3.8 4.3  CL 104 98  --  96* 97* 97*  CO2 19* 23  --  25 27 28   GLUCOSE 169* 138*  --  251* 136* 122*  BUN 44* 52*  --  50* 46* 45*  CREATININE 1.89* 2.28*  --  1.53* 1.44* 1.21  CALCIUM 5.5* 8.0*  --  7.6* 7.6* 7.8*  MG  --  2.1 2.0 2.2 2.3 2.3  PHOS 5.0*  --  4.5 3.7 3.2 2.9  3.0      This patient is critically ill with multiple organ system failure which  requires frequent high complexity decision making, assessment, support, evaluation, and titration of therapies. This was completed through the application of advanced monitoring technologies and extensive interpretation of multiple databases. During this encounter critical care time was devoted to patient care services described in this note for 35 minutes.  Leita SHAUNNA Gaskins, DO 02/24/24 1:09 PM [facility]  For contact information, see Amion. If no response to pager, please call PCCM consult pager. After hours, 7PM- 7AM, please call Elink.

## 2024-02-25 ENCOUNTER — Other Ambulatory Visit: Payer: Self-pay

## 2024-02-25 ENCOUNTER — Encounter (HOSPITAL_COMMUNITY): Payer: Self-pay

## 2024-02-25 DIAGNOSIS — G931 Anoxic brain damage, not elsewhere classified: Secondary | ICD-10-CM | POA: Diagnosis not present

## 2024-02-25 DIAGNOSIS — Z515 Encounter for palliative care: Secondary | ICD-10-CM | POA: Diagnosis not present

## 2024-02-25 DIAGNOSIS — Z7189 Other specified counseling: Secondary | ICD-10-CM | POA: Diagnosis not present

## 2024-02-25 DIAGNOSIS — J9601 Acute respiratory failure with hypoxia: Secondary | ICD-10-CM | POA: Diagnosis not present

## 2024-02-25 DIAGNOSIS — I480 Paroxysmal atrial fibrillation: Secondary | ICD-10-CM | POA: Diagnosis not present

## 2024-02-25 DIAGNOSIS — I469 Cardiac arrest, cause unspecified: Secondary | ICD-10-CM | POA: Diagnosis not present

## 2024-02-25 LAB — RENAL FUNCTION PANEL
Albumin: 2.2 g/dL — ABNORMAL LOW (ref 3.5–5.0)
Anion gap: 12 (ref 5–15)
BUN: 47 mg/dL — ABNORMAL HIGH (ref 8–23)
CO2: 31 mmol/L (ref 22–32)
Calcium: 7.9 mg/dL — ABNORMAL LOW (ref 8.9–10.3)
Chloride: 98 mmol/L (ref 98–111)
Creatinine, Ser: 1.03 mg/dL (ref 0.61–1.24)
GFR, Estimated: >60 mL/min (ref >60)
Glucose, Bld: 136 mg/dL — ABNORMAL HIGH (ref 70–99)
Phosphorus: 3.1 mg/dL (ref 2.5–4.6)
Potassium: 4 mmol/L (ref 3.5–5.1)
Sodium: 141 mmol/L (ref 135–145)

## 2024-02-25 LAB — CBC
HCT: 38.3 % — ABNORMAL LOW (ref 39.0–52.0)
Hemoglobin: 13.4 g/dL (ref 13.0–17.0)
MCH: 30.2 pg (ref 26.0–34.0)
MCHC: 35 g/dL (ref 30.0–36.0)
MCV: 86.3 fL (ref 80.0–100.0)
Platelets: 264 K/uL (ref 150–400)
RBC: 4.44 MIL/uL (ref 4.22–5.81)
RDW: 13.6 % (ref 11.5–15.5)
WBC: 12.7 K/uL — ABNORMAL HIGH (ref 4.0–10.5)
nRBC: 0 % (ref 0.0–0.2)

## 2024-02-25 LAB — GLUCOSE, CAPILLARY
Glucose-Capillary: 138 mg/dL — ABNORMAL HIGH (ref 70–99)
Glucose-Capillary: 143 mg/dL — ABNORMAL HIGH (ref 70–99)
Glucose-Capillary: 92 mg/dL (ref 70–99)
Glucose-Capillary: 92 mg/dL (ref 70–99)
Glucose-Capillary: 112 mg/dL — ABNORMAL HIGH (ref 70–99)
Glucose-Capillary: 163 mg/dL — ABNORMAL HIGH (ref 70–99)

## 2024-02-25 LAB — MAGNESIUM: Magnesium: 2.4 mg/dL (ref 1.7–2.4)

## 2024-02-25 MED ORDER — FENTANYL 2500MCG IN NS 250ML (10MCG/ML) PREMIX INFUSION: 0.0000 ug/h | INTRAVENOUS | Status: DC

## 2024-02-25 MED ORDER — POLYETHYLENE GLYCOL 3350 17 G PO PACK
17.0000 g | PACK | Freq: Every day | ORAL | Status: DC
  Administered 2024-02-26 – 2024-02-27 (×2): 17 g

## 2024-02-25 MED ORDER — BUSPIRONE HCL 10 MG PO TABS
10.0000 mg | ORAL_TABLET | Freq: Three times a day (TID) | ORAL | Status: DC
  Administered 2024-02-25 – 2024-02-29 (×13): 10 mg
  Filled 2024-02-25 (×13): qty 1

## 2024-02-25 MED ORDER — FENTANYL CITRATE (PF) 50 MCG/ML IJ SOSY
50.0000 ug | PREFILLED_SYRINGE | INTRAMUSCULAR | Status: DC | PRN
  Administered 2024-02-26 – 2024-02-28 (×5): 100 ug via INTRAVENOUS
  Filled 2024-02-25 (×6): qty 2

## 2024-02-25 MED ORDER — FENTANYL CITRATE (PF) 50 MCG/ML IJ SOSY
50.0000 ug | PREFILLED_SYRINGE | Freq: Once | INTRAMUSCULAR | Status: AC
  Administered 2024-02-25: 50 ug via INTRAVENOUS
  Filled 2024-02-25: qty 1

## 2024-02-25 MED ORDER — FUROSEMIDE 10 MG/ML IJ SOLN
40.0000 mg | Freq: Once | INTRAMUSCULAR | Status: AC
  Administered 2024-02-25: 40 mg via INTRAVENOUS
  Filled 2024-02-25: qty 4

## 2024-02-25 MED ORDER — FENTANYL CITRATE (PF) 50 MCG/ML IJ SOSY
50.0000 ug | PREFILLED_SYRINGE | INTRAMUSCULAR | Status: AC | PRN
  Administered 2024-02-25 – 2024-02-28 (×3): 50 ug via INTRAVENOUS
  Filled 2024-02-25 (×2): qty 1

## 2024-02-25 MED ORDER — DOCUSATE SODIUM 50 MG/5ML PO LIQD
100.0000 mg | Freq: Two times a day (BID) | ORAL | Status: DC
  Administered 2024-02-26 – 2024-02-27 (×4): 100 mg
  Filled 2024-02-25 (×3): qty 10

## 2024-02-25 NOTE — Progress Notes (Signed)
 Nutrition Follow-up  DOCUMENTATION CODES:  Non-severe (moderate) malnutrition in context of social or environmental circumstances  INTERVENTION:  Continue tube feeding via OGT: Vital 1.5 at 60 ml/h (1440 ml per day) Prosource TF20 60 ml 1x/d Provides 2240 kcal, 117 gm protein, 1100 ml free water daily  NUTRITION DIAGNOSIS:  Moderate Malnutrition related to social / environmental circumstances (drug use, homelessness) as evidenced by moderate fat depletion, severe muscle depletion. - remains applicable  GOAL:  Patient will meet greater than or equal to 90% of their needs - progressing  MONITOR:  TF tolerance, Skin, Vent status, Labs  REASON FOR ASSESSMENT:  Ventilator, Consult Enteral/tube feeding initiation and management  ASSESSMENT:  Pt with hx of CAD, CHF, and drug use admitted after being found down and unresponsive on sidewalk. Pt required 12 rounds of defibrillation and prolonged CPR until ROSC could be maintained. Intubated prior to arrival in ED. Pt also with concerns for head trauma.  10/24 - admitted to ICU, intubated  10/27 - TF initiated  Pt resting in bed at the time of assessment. Remains on vent support, imaging suggestive of significant anoxic brain injury. Currently TOC working on office manager for pt. APS also involved in case to aid in making decisions.   Pt discussed during ICU rounds and with RN and MD. RN reports pt tolerating feeds well at goal. Will leave current nutrition interventions in place.  MV: 8.6 L/min Temp (24hrs), Avg:98.5 F (36.9 C), Min:97.9 F (36.6 C), Max:99.1 F (37.3 C) MAP (cuff): 66-88 mmHg this shift  Admit weight: 77.1 kg  Current weight: 79.4 kg    Intake/Output Summary (Last 24 hours) at 02/25/2024 1523 Last data filed at 02/25/2024 1300 Gross per 24 hour  Intake 2119.25 ml  Output 3925 ml  Net -1805.75 ml  Net IO Since Admission: -2,800.94 mL [02/25/24 1523]  Drains/Lines: OGT 16 Fr. (Gastric) CVC  Triple lumen, left IJ UOP 5150 mL/d  Nutritionally Relevant Medications: Scheduled Meds:  atorvastatin  40 mg Per Tube Daily   docusate  100 mg Per Tube BID   PROSource TF20  60 mL Per Tube Daily   furosemide  40 mg Intravenous Once   pantoprazole IV  40 mg Intravenous QHS   polyethylene glycol  17 g Per Tube Daily   thiamine  100 mg Per Tube Daily   Continuous Infusions:  dexmedetomidine (PRECEDEX) IV infusion 1.1 mcg/kg/hr (02/25/24 1000)   feeding supplement (VITAL 1.5 CAL) 60 mL/hr at 02/25/24 1000   meropenem (MERREM) IV Stopped (02/25/24 0647)   PRN Meds: docusate, ondansetron IV, polyethylene glycol  Labs Reviewed: BUN 47 CBG ranges from 82-143 mg/dL over the last 24 hours HgbA1c 5.1%  NUTRITION - FOCUSED PHYSICAL EXAM: Flowsheet Row Most Recent Value  Orbital Region Moderate depletion  Upper Arm Region Severe depletion  Thoracic and Lumbar Region Unable to assess  [cooling pads on chest]  Buccal Region Moderate depletion  Temple Region Moderate depletion  Clavicle Bone Region Moderate depletion  Clavicle and Acromion Bone Region Severe depletion  Scapular Bone Region Moderate depletion  Dorsal Hand No depletion  Patellar Region Severe depletion  Anterior Thigh Region Severe depletion  Posterior Calf Region Mild depletion  Edema (RD Assessment) None  Hair Reviewed  Eyes Reviewed  Mouth Reviewed  Skin Reviewed  Nails Reviewed    Diet Order:   Diet Order             Diet NPO time specified  Diet effective now  EDUCATION NEEDS:  Not appropriate for education at this time  Skin:  Skin Assessment: Reviewed RN Assessment  Last BM:  10/29 - type 4  Height:  Ht Readings from Last 1 Encounters:  02/21/24 6' 2.02 (1.88 m)    Weight:  Wt Readings from Last 1 Encounters:  02/25/24 80.4 kg    Ideal Body Weight:  86.4 kg  BMI:  Body mass index is 22.75 kg/m.  Estimated Nutritional Needs:  Kcal:  2200-2400 kcal/d Protein:   110-125g/d Fluid:  2L/d    Vernell Lukes, RD, LDN, CNSC Registered Dietitian II Please reach out via secure chat

## 2024-02-25 NOTE — Plan of Care (Signed)

## 2024-02-25 NOTE — Progress Notes (Signed)
 NAME:  [name], MRN:  [id number], DOB:  03-05-1961, LOS: 6 ADMISSION DATE:  02/19/2024, CONSULTATION DATE:  02/19/2024 REFERRING MD:  Lonni Sakai, MD, CHIEF COMPLAINT:  cardiac arrest  History of Present Illness:  63 y/o male with PMH for CAD who had a PCI in 2010 for anterior STEMI and found to have 3 vessel disease and went for CABG in 2010 (LIMA-LAD, SVG-Ramus, SVG -OM, SVG-RPDA), HFrEF, Ischemic Cardiomyopathy-last echo 2011 showing EF 25-30%, with akinesis to dyskinesis of the entire anterior, anteroseptal, and apical myocardium, and cocaine use disorder who was found down [facility] with unknown down time and CPR initiated 1655.  Patient noted to be in V fib arrest.  He was shocked approximately 12 times by EMS, IO placed and 450mg  Amiodarone, 6mg  Epinephrine, 1 liter IVF, 2gm Magnesium.  In ED patient on Levophed drip at 15mcg and Amiodarone drip.  He was intubated and sedated.  Code STEMI called, ST elevations in anteriolat leads and I spoke with Dr End, there is a potential ICH and so there is hesitation to take patient to cath lab given Heparin during cath. LA 9.3, Cr 1.30, WBC 13.8, INR 1.5, Glucose 303.  CT head also showing a soft tissue mass in the left maxillary sinus. Pertinent  Medical History  CAD who had a PCI in 2010 for anterior STEMI and found to have 3 vessel disease and went for CABG in 2010 (LIMA-LAD, SVG-Ramus, SVG -OM, SVG-RPDA), HFrEF, Ischemic Cardiomyopahty-last echo 2011 showing EF 25-30%, with akinesis to dyskinesis of the entire anterior, anteroseptal, and apical myocardium, and cocaine use disorder   Significant Hospital Events: Including procedures, antibiotic start and stop dates in addition to other pertinent events   10/24: Admit to ICU 10.28 MRI with anoxic brain injury  Interim History / Subjective:  Overnight fentanyl infusion added for vent dyssynchrony. Remains on precedex.  Objective    Blood  pressure 105/60, pulse 82, temperature 98.6 F (37 C), temperature source Esophageal, resp. rate 13, height 6' 2.02 (1.88 m), weight 80.4 kg, SpO2 100%.    Vent Mode: PSV;CPAP FiO2 (%):  [40 %] 40 % Set Rate:  [18 bmp] 18 bmp Vt Set:  [650 mL] 650 mL PEEP:  [5 cmH20] 5 cmH20 Pressure Support:  [5 cmH20] 5 cmH20 Plateau Pressure:  [10 cmH20] 10 cmH20   Intake/Output Summary (Last 24 hours) at 02/25/2024 0942 Last data filed at 02/25/2024 0800 Gross per 24 hour  Intake 2028.89 ml  Output 5275 ml  Net -3246.11 ml   Filed Weights   02/23/24 0403 02/24/24 0500 02/25/24 0345  Weight: 84.4 kg 81.8 kg 80.4 kg    Examination: General: critically ill appearing man lying in bed in NAD, intubated, sedated on precedex and fentanyl HEENT: abrasions and laceration similar, ETT Neuro: examined on sedation-- turned off later in the morning. Reactive pupils. No withdrawal from pain. + pupil, no corneals.  Chest: breathing comfortably on MV Heart: S1S2, RRR. NSR on tele.  Abdomen: soft, NT   BUN 47 Cr 1.03 WBC 12.7 H/H 13.4/ 38.3 Platelets 264  Resolved problem list  Acute high anion gap metabolic acidosis/lactic acidosis Lactic acidosis Hypokalemia Cardiogenic shock Thrombocytopenia, possibly due to antibiotics  Assessment and Plan  Acute anterio-lateral STEMI, S/p out-of-hospital V-fib cardiac arrest Acute on chronic HFrEF due to ischemic cardiomyopathy   New onset paroxysmal Afib; NSR today -con't DAPT, holding metoprolol due to low EF & previous cardiogenic shock -heparin for Afib -enteral amiodarone -  monitor electrolytes and replete as needed  -con't lasix today  Acute respiratory failure with hypoxia on MV Probable aspiration pneumonia-- polymicrobial- citrobacter, MSSA, K pneumoniae -LTVV -VAP prevention protocol -PAD protocol-- goal RASS 0. D/c fentanyl gtt, titrate off precedex.  -daily SAT & SBT; mental status precludes extubation unless for palliative  intent. -meropenem per ID recommendations  Acute encephalopathy, anoxic brain injury confirmed on MRI Myoclonic seizures -Appreciate neurology's management -Palliative care trying to contact sister-- no progress yet. Need to have goals of care discussions with family to decide future directions given severe injury and very low chance of meaningful recovery.  Hypoglycemia- resolved -doing well off dextrose -con't TF  Hypokalemia, resolved -Monitor  Acute kidney injury due to ischemic ATN postcardiac arrest, resolved -strict I/O -renally dose meds, avoid nephrotoxic meds -monitor  Hypocalcemia -monitor, con't TF  Chronic subdural hygroma.  Subdural hemorrhage was ruled out Left maxillary sinus mass -OP follow up as needed  Forehead ecchymoses, abrasions -supportive care -Can take staples out after 7 to 10 days  No family at bedside today.  Labs   CBC: Recent Labs  Lab 02/21/24 0212 02/21/24 1057 02/22/24 1004 02/23/24 0400 02/24/24 0231 02/25/24 0042  WBC 23.6*  --  11.7* 12.2* 13.9* 12.7*  HGB 17.0 15.3 14.2 12.5* 13.0 13.4  HCT 51.5 45.0 40.9 36.2* 38.1* 38.3*  MCV 88.8  --  86.3 87.0 87.8 86.3  PLT 220  --  146* 148* 145* 264    Basic Metabolic Panel: Recent Labs  Lab 02/22/24 0800 02/22/24 1700 02/23/24 0400 02/23/24 1620 02/24/24 0231 02/25/24 0240  NA 135  --  132* 134* 134* 141  K 3.8  --  3.3* 3.8 4.3 4.0  CL 98  --  96* 97* 97* 98  CO2 23  --  25 27 28 31   GLUCOSE 138*  --  251* 136* 122* 136*  BUN 52*  --  50* 46* 45* 47*  CREATININE 2.28*  --  1.53* 1.44* 1.21 1.03  CALCIUM 8.0*  --  7.6* 7.6* 7.8* 7.9*  MG 2.1 2.0 2.2 2.3 2.3 2.4  PHOS  --  4.5 3.7 3.2 2.9  3.0 3.1      This patient is critically ill with multiple organ system failure which requires frequent high complexity decision making, assessment, support, evaluation, and titration of therapies. This was completed through the application of advanced monitoring technologies and  extensive interpretation of multiple databases. During this encounter critical care time was devoted to patient care services described in this note for 33 minutes.  Leita SHAUNNA Gaskins, DO 02/25/24 10:20 AM [facility]  For contact information, see Amion. If no response to pager, please call PCCM consult pager. After hours, 7PM- 7AM, please call Elink.

## 2024-02-25 NOTE — Progress Notes (Signed)
 Palliative:  HPI: 63 y.o. male  with past medical history of CAD s/p PCI 2010 for anterior STEMI, 3 vessel disease s/p CABG 2010, HFrEF 25-30% 2011, ICM, cocaine use admitted on 02/19/2024 with unknown downtime found in Vfib arrest. MRI consistent with anoxic brain injury. Prognosis is poor.   Met at bedside. No changes. Discussed with RN and PCCM. I attempted to call sister, [name], and left voicemail. I was notified by CSW that they were able to reach Ms. Whitestone and she did not wish to participate in discussions or decisions for her brother. She did share that there is another brother and a son but had no information for their contact or whereabouts. I attempted to reach Ms. Whitestone to see if I could obtain names of these family members but I could not reach her.   I left voicemail for APS but did not hear back from them. I discussed with team that we can discuss and begin to make decisions and plans with pastor Patrick who has been to visit and has known [name] for many years through services provided through his church. I was able to call and speak with [name]. Patrick was open to conversation. He confirms being in contact with [name] off and on throughout the years. He has no knowledge of any family or close friends of [name]. Patrick agrees to act on behalf of [name] for decision making and would like for [name] to also participate jointly with him to make decisions. [name] is familiar with [name] through a [facility] that she runs.   I spoke further with Patrick about [name] condition. We reviewed that he was found down and required resuscitation. We discussed that he had unknown downtime and that it is unknown how long his brain was deprived of oxygen and this is what has led to brain damage. We discussed lack of purposeful responses but some intact reflexes. I shared expectations and poor prognosis. I shared that the medical team  needs assistance to know how to best care for [name] given poor prognosis and expectation that he would not survive long off breathing machine. Patrick asks good and insightful questions. We discussed expectation that he may live days off breathing machine but will not be expected to protect his airway and thrive. We reviewed code status and Patrick would be supportive of DNR. Patrick shares that he would like to discuss with Patrick and I offered to call her as well. He shares that they would both be out of town but would like to be present for one way extubation and they will likely be available next together on Monday to gather at bedside to support him through extubation. I will call him again Sunday to coordinate.   I attempted to call and speak with [name] but unable to reach. I left her voicemail.   If [name] also agrees with DNR status I would recommend to put DNR in place. If she agrees with tentative plan for one way extubation continue with tentative plan to coordinate Sunday for potentially Monday. This will also allow time for due diligence for attempts to find family. Unfortunately I doubt that we will be able to find family to make these decisions so I encourage ongoing conversations with Patrick and Patrick.   Updated PCCM, RN, CSW.   Exam: Ill-appearing. Critically ill. Tolerating vent via ETT. No distress. VSS. No tachypnea. No posturing noted today during my  visit. + Gag reflex. Reactive pupils. Abd soft. Extremities cool to touch.   Plan: - Continue efforts to locate family.  - Decisions to be made by friendly connections [name] and [name] (recommend DNR if Patrick also agrees) - Tentative plan for one way extubation to comfort care with Patrick and Patrick planning to be at bedside  65 min  [name], NP Palliative Medicine Team Pager [phone] (Please see [email or web address] for schedule) Team Phone [phone]

## 2024-02-25 NOTE — Plan of Care (Signed)
  Problem: Metabolic: Goal: Ability to maintain appropriate glucose levels will improve Outcome: Progressing   Problem: Nutritional: Goal: Maintenance of adequate nutrition will improve Outcome: Progressing   Problem: Skin Integrity: Goal: Risk for impaired skin integrity will decrease Outcome: Progressing   Problem: Clinical Measurements: Goal: Will remain free from infection Outcome: Progressing   Problem: Activity: Goal: Risk for activity intolerance will decrease Outcome: Progressing   Problem: Safety: Goal: Ability to remain free from injury will improve Outcome: Progressing   Problem: Skin Integrity: Goal: Risk for impaired skin integrity will decrease Outcome: Progressing

## 2024-02-25 NOTE — TOC CM/SW Note (Addendum)
 Transition of Care [facility]) - Inpatient Brief Assessment   Patient Details  Name: [name] MRN: [id number] Date of Birth: 01/02/1961  Transition of [facility] CM/SW Contact:    [name], LCSWA Phone Number: 02/25/2024, 8:57 AM   Clinical Narrative:  8:57 AM [name] provided CSW with patient's sister's contact information [name], [phone]). Medical team made aware.  12:08 PM CSW provided APS social worker, [name] with patient's sister's contact information. CSW attempted to speak with [name], but there was no response and voicemail was left. CSW will continue to follow.  12:27 PM [name] returned CSW phone call. [name] declined offer of participating in patient's care. [name] confirmed that patient does have a brother and that she has not had contact with the patient and their brother in 30 years. [name] informed CSW that patient has a son but does not have his contact information. [name] stated that patient has never been married. CSW relayed information to medical team. CSW attempted to relay information to APS social worker, but there was no response and a voicemail was left. CSW will continue to follow.   Transition of Care Asessment: Insurance and Status: Insurance coverage has been reviewed Patient has primary care physician: Yes Home environment has been reviewed: N/A Prior level of function:: N/A Prior/Current Home Services: No current home services Social Drivers of Health Review:  (Patient is currently intubated) Readmission risk has been reviewed: Yes (Currently Yellow 17%) Transition of care needs: transition of care needs identified, TOC will continue to follow

## 2024-02-26 DIAGNOSIS — S0990XA Unspecified injury of head, initial encounter: Secondary | ICD-10-CM | POA: Diagnosis not present

## 2024-02-26 DIAGNOSIS — I5021 Acute systolic (congestive) heart failure: Secondary | ICD-10-CM | POA: Diagnosis present

## 2024-02-26 DIAGNOSIS — I469 Cardiac arrest, cause unspecified: Secondary | ICD-10-CM | POA: Diagnosis not present

## 2024-02-26 DIAGNOSIS — J9601 Acute respiratory failure with hypoxia: Secondary | ICD-10-CM | POA: Diagnosis not present

## 2024-02-26 DIAGNOSIS — Z789 Other specified health status: Secondary | ICD-10-CM | POA: Diagnosis not present

## 2024-02-26 DIAGNOSIS — G931 Anoxic brain damage, not elsewhere classified: Secondary | ICD-10-CM | POA: Diagnosis not present

## 2024-02-26 DIAGNOSIS — Z711 Person with feared health complaint in whom no diagnosis is made: Secondary | ICD-10-CM

## 2024-02-26 DIAGNOSIS — Z515 Encounter for palliative care: Secondary | ICD-10-CM | POA: Diagnosis not present

## 2024-02-26 DIAGNOSIS — I480 Paroxysmal atrial fibrillation: Secondary | ICD-10-CM | POA: Diagnosis not present

## 2024-02-26 LAB — BASIC METABOLIC PANEL WITH GFR
Anion gap: 14 (ref 5–15)
BUN: 41 mg/dL — ABNORMAL HIGH (ref 8–23)
CO2: 30 mmol/L (ref 22–32)
Calcium: 7.4 mg/dL — ABNORMAL LOW (ref 8.9–10.3)
Chloride: 103 mmol/L (ref 98–111)
Creatinine, Ser: 1.13 mg/dL (ref 0.61–1.24)
GFR, Estimated: >60 mL/min (ref >60)
Glucose, Bld: 135 mg/dL — ABNORMAL HIGH (ref 70–99)
Potassium: 3.2 mmol/L — ABNORMAL LOW (ref 3.5–5.1)
Sodium: 147 mmol/L — ABNORMAL HIGH (ref 135–145)

## 2024-02-26 LAB — GLUCOSE, CAPILLARY
Glucose-Capillary: 124 mg/dL — ABNORMAL HIGH (ref 70–99)
Glucose-Capillary: 126 mg/dL — ABNORMAL HIGH (ref 70–99)
Glucose-Capillary: 126 mg/dL — ABNORMAL HIGH (ref 70–99)
Glucose-Capillary: 149 mg/dL — ABNORMAL HIGH (ref 70–99)
Glucose-Capillary: 152 mg/dL — ABNORMAL HIGH (ref 70–99)

## 2024-02-26 LAB — MAGNESIUM: Magnesium: 2.2 mg/dL (ref 1.7–2.4)

## 2024-02-26 MED ORDER — FREE WATER
200.0000 mL | Status: DC
  Administered 2024-02-26 – 2024-02-28 (×15): 200 mL

## 2024-02-26 MED ORDER — POTASSIUM CHLORIDE 20 MEQ PO PACK: 40.0000 meq | PACK | ORAL | Status: DC

## 2024-02-26 MED ORDER — POTASSIUM CHLORIDE 20 MEQ PO PACK
40.0000 meq | PACK | ORAL | Status: AC
  Administered 2024-02-26 (×3): 40 meq
  Filled 2024-02-26 (×3): qty 2

## 2024-02-26 MED ORDER — FUROSEMIDE 10 MG/ML IJ SOLN
40.0000 mg | Freq: Once | INTRAMUSCULAR | Status: AC
  Administered 2024-02-26: 40 mg via INTRAVENOUS
  Filled 2024-02-26: qty 4

## 2024-02-26 NOTE — Progress Notes (Addendum)
 Daily Progress Note   Patient Name: [name]       Date: 02/26/2024 DOB: Jan 18, 1961  Age: 63 y.o. MRN#: [id number] Attending Physician: Gretta Leita SQUIBB, DO Primary Care Physician: Scarlett Ronal Caldron, NP Admit Date: 02/19/2024  Reason for Consultation/Follow-up: Establishing goals of care  Subjective: I have reviewed medical records including EPIC notes, MAR, no available advanced directives but noted APS as legal guardian, and labs. Received report from primary RN -no acute concerns.  Patient is off sedation and off pressor support.  Per RN he has positive cough, gag, corneal reflexes.  Went to visit patient at bedside-no family/visitors present.  Patient was lying in bed with eyes open and blinking though has no purposeful or meaningful movement. No signs or non-verbal gestures of pain or discomfort noted. No respiratory distress, increased work of breathing, or secretions noted.  He remains intubated.  He is receiving artificial nutrition.  1:15 PM Attempted to call friend/[name] to discuss diagnosis, prognosis, GOC, EOL wishes, disposition, and options specific to DNR and one-way extubation Monday - no answer - confidential voicemail left and PMT phone number provided with request to return call.  Of note this is day 2 at unsuccessful attempts to contact [name]. If unable to speak with her by 11/2, will plan to continue goals of care discussions with Carrol Ghazi and his thoughts on proceeding with DNR and one-way extubation Monday without [name]'s input.   Length of Stay: 7  Current Medications: Scheduled Meds:   amiodarone  200 mg Per Tube BID   aspirin  81 mg Per Tube Daily   atorvastatin  40 mg Per Tube Daily   busPIRone  10 mg Per Tube TID   Chlorhexidine Gluconate Cloth  6 each  Topical Daily   clopidogrel  75 mg Per Tube Daily   docusate  100 mg Per Tube BID   docusate  100 mg Per Tube BID   feeding supplement (PROSource TF20)  60 mL Per Tube Daily   free water  200 mL Per Tube Q4H   heparin injection (subcutaneous)  5,000 Units Subcutaneous Q8H   levETIRAcetam  500 mg Intravenous Q12H   mouth rinse  15 mL Mouth Rinse Q2H   pantoprazole (PROTONIX) IV  40 mg Intravenous QHS   polyethylene glycol  17 g Per Tube Daily  polyethylene glycol  17 g Per Tube Daily   potassium chloride  40 mEq Per Tube Q4H   sodium chloride flush  10-40 mL Intracatheter Q12H    Continuous Infusions:  dexmedetomidine (PRECEDEX) IV infusion Stopped (02/26/24 0920)   feeding supplement (VITAL 1.5 CAL) 60 mL/hr at 02/26/24 1000   meropenem (MERREM) IV Stopped (02/26/24 0542)    PRN Meds: acetaminophen  **OR** acetaminophen  (TYLENOL ) oral liquid 160 mg/5 mL **OR** acetaminophen , docusate, fentaNYL (SUBLIMAZE) injection, fentaNYL (SUBLIMAZE) injection, ondansetron (ZOFRAN) IV, mouth rinse, polyethylene glycol, sodium chloride flush  Physical Exam Vitals and nursing note reviewed.  Constitutional:      General: He is not in acute distress.    Appearance: He is cachectic. He is ill-appearing.  Pulmonary:     Effort: No respiratory distress.  Skin:    General: Skin is cool and dry.             Vital Signs: BP (!) 150/88   Pulse (!) 108   Temp 100.2 F (37.9 C)   Resp (!) 22   Ht 6' 2.02 (1.88 m)   Wt 81 kg   SpO2 96%   BMI 22.91 kg/m  SpO2: SpO2: 96 % O2 Device: O2 Device: Ventilator O2 Flow Rate:    Intake/output summary:  Intake/Output Summary (Last 24 hours) at 02/26/2024 1237 Last data filed at 02/26/2024 1000 Gross per 24 hour  Intake 2213.09 ml  Output 1700 ml  Net 513.09 ml   LBM: Last BM Date : 02/25/24 Baseline Weight: Weight: 70 kg Most recent weight: Weight: 81 kg       Palliative Assessment/Data: PPS 30% with tube feeds      Patient Active  Problem List   Diagnosis Date Noted   Malnutrition of moderate degree 02/23/2024   On mechanically assisted ventilation (HCC) 02/22/2024   AKI (acute kidney injury) 02/22/2024   Paroxysmal atrial fibrillation (HCC) 02/22/2024   Acute respiratory failure with hypoxia (HCC) 02/21/2024   Cardiac arrest (HCC) 02/19/2024    Palliative Care Assessment & Plan   Patient Profile: 63 y.o. male with past medical history of CAD s/p PCI 2010 for anterior STEMI, 3 vessel disease s/p CABG 2010, HFrEF 25-30% 2011, ICM, cocaine use admitted on 02/19/2024 with unknown downtime found in Vfib arrest. MRI consistent with anoxic brain injury. Prognosis is poor.   Assessment: Principal Problem:   Cardiac arrest [facility]) Active Problems:   Acute respiratory failure with hypoxia (HCC)   On mechanically assisted ventilation (HCC)   AKI (acute kidney injury)   Paroxysmal atrial fibrillation (HCC)   Malnutrition of moderate degree   Concern about end of life  Recommendations/Plan: Continue full code/full scope Continue efforts to locate family Decisions to be made by friendly connections [name] and Raphael Myron.  Unfortunately, this is day two at attempts to contact [name] without success. If unable to speak with her by Sunday, will discuss with Layman proceeding with DNR and one way extubation Monday without [name]'s input PMT will continue to follow and support holistically  Goals of Care and Additional Recommendations: Limitations on Scope of Treatment: Full Scope Treatment  Code Status:    Code Status Orders  (From admission, onward)           Start     Ordered   02/19/24 2012  Full code  Continuous       Question:  By:  Answer:  Default: patient does not have capacity for decision making, no surrogate or prior directive available  02/19/24 2013           Code Status History     This patient has a current code status but no historical code status.       Prognosis:   poor  Discharge Planning: To Be Determined  Care plan was discussed with primary RN, Dr. Gretta  Thank you for allowing the Palliative Medicine Team to assist in the care of this patient.   Total Time 35 minutes Prolonged Time Billed  no       Jeoffrey CHRISTELLA Sharps, NP  Please contact Palliative Medicine Team phone at [phone] for questions and concerns.   *Portions of this note are a verbal dictation therefore any spelling and/or grammatical errors are due to the Dragon Medical One system interpretation.

## 2024-02-26 NOTE — Plan of Care (Signed)
 Problem: Education: Goal: Ability to describe self-care measures that may prevent or decrease complications (Diabetes Survival Skills Education) will improve 02/26/2024 1545 by Val Violeta FALCON, RN Outcome: Not Progressing 02/26/2024 1545 by Val Violeta FALCON, RN Outcome: Progressing Goal: Individualized Educational Video(s) 02/26/2024 1545 by Val Violeta FALCON, RN Outcome: Not Progressing 02/26/2024 1545 by Val Violeta FALCON, RN Outcome: Progressing   Problem: Coping: Goal: Ability to adjust to condition or change in health will improve 02/26/2024 1545 by Val Violeta FALCON, RN Outcome: Not Progressing 02/26/2024 1545 by Val Violeta FALCON, RN Outcome: Progressing   Problem: Fluid Volume: Goal: Ability to maintain a balanced intake and output will improve 02/26/2024 1545 by Val Violeta FALCON, RN Outcome: Not Progressing 02/26/2024 1545 by Val Violeta FALCON, RN Outcome: Progressing   Problem: Health Behavior/Discharge Planning: Goal: Ability to identify and utilize available resources and services will improve 02/26/2024 1545 by Val Violeta FALCON, RN Outcome: Not Progressing 02/26/2024 1545 by Val Violeta FALCON, RN Outcome: Progressing Goal: Ability to manage health-related needs will improve 02/26/2024 1545 by Val Violeta FALCON, RN Outcome: Not Progressing 02/26/2024 1545 by Val Violeta FALCON, RN Outcome: Progressing   Problem: Metabolic: Goal: Ability to maintain appropriate glucose levels will improve 02/26/2024 1545 by Val Violeta FALCON, RN Outcome: Not Progressing 02/26/2024 1545 by Val Violeta FALCON, RN Outcome: Progressing   Problem: Nutritional: Goal: Maintenance of adequate nutrition will improve 02/26/2024 1545 by Val Violeta FALCON, RN Outcome: Not Progressing 02/26/2024 1545 by Val Violeta FALCON, RN Outcome: Progressing Goal: Progress toward achieving an optimal weight will improve 02/26/2024 1545 by Val Violeta FALCON, RN Outcome: Not  Progressing 02/26/2024 1545 by Val Violeta FALCON, RN Outcome: Progressing   Problem: Skin Integrity: Goal: Risk for impaired skin integrity will decrease 02/26/2024 1545 by Val Violeta FALCON, RN Outcome: Not Progressing 02/26/2024 1545 by Val Violeta FALCON, RN Outcome: Progressing   Problem: Tissue Perfusion: Goal: Adequacy of tissue perfusion will improve 02/26/2024 1545 by Val Violeta FALCON, RN Outcome: Not Progressing 02/26/2024 1545 by Val Violeta FALCON, RN Outcome: Progressing   Problem: Education: Goal: Knowledge of General Education information will improve Description: Including pain rating scale, medication(s)/side effects and non-pharmacologic comfort measures 02/26/2024 1545 by Val Violeta FALCON, RN Outcome: Not Progressing 02/26/2024 1545 by Val Violeta FALCON, RN Outcome: Progressing   Problem: Health Behavior/Discharge Planning: Goal: Ability to manage health-related needs will improve 02/26/2024 1545 by Val Violeta FALCON, RN Outcome: Not Progressing 02/26/2024 1545 by Val Violeta FALCON, RN Outcome: Progressing   Problem: Clinical Measurements: Goal: Ability to maintain clinical measurements within normal limits will improve 02/26/2024 1545 by Val Violeta FALCON, RN Outcome: Not Progressing 02/26/2024 1545 by Val Violeta FALCON, RN Outcome: Progressing Goal: Will remain free from infection 02/26/2024 1545 by Val Violeta FALCON, RN Outcome: Not Progressing 02/26/2024 1545 by Val Violeta FALCON, RN Outcome: Progressing Goal: Diagnostic test results will improve 02/26/2024 1545 by Val Violeta FALCON, RN Outcome: Not Progressing 02/26/2024 1545 by Val Violeta FALCON, RN Outcome: Progressing Goal: Respiratory complications will improve 02/26/2024 1545 by Val Violeta FALCON, RN Outcome: Not Progressing 02/26/2024 1545 by Val Violeta FALCON, RN Outcome: Progressing Goal: Cardiovascular complication will be avoided 02/26/2024 1545 by Val Violeta FALCON, RN Outcome: Not Progressing 02/26/2024 1545 by Val Violeta FALCON, RN Outcome: Progressing   Problem: Activity: Goal: Risk for activity intolerance will decrease 02/26/2024 1545 by Val Violeta FALCON, RN Outcome: Not Progressing 02/26/2024 1545 by Val Violeta FALCON, RN Outcome: Progressing   Problem: Nutrition: Goal: Adequate nutrition will be maintained  02/26/2024 1545 by Val Violeta FALCON, RN Outcome: Not Progressing 02/26/2024 1545 by Val Violeta FALCON, RN Outcome: Progressing   Problem: Coping: Goal: Level of anxiety will decrease 02/26/2024 1545 by Val Violeta FALCON, RN Outcome: Not Progressing 02/26/2024 1545 by Val Violeta FALCON, RN Outcome: Progressing   Problem: Elimination: Goal: Will not experience complications related to bowel motility 02/26/2024 1545 by Val Violeta FALCON, RN Outcome: Not Progressing 02/26/2024 1545 by Val Violeta FALCON, RN Outcome: Progressing Goal: Will not experience complications related to urinary retention 02/26/2024 1545 by Val Violeta FALCON, RN Outcome: Not Progressing 02/26/2024 1545 by Val Violeta FALCON, RN Outcome: Progressing   Problem: Pain Managment: Goal: General experience of comfort will improve and/or be controlled 02/26/2024 1545 by Val Violeta FALCON, RN Outcome: Not Progressing 02/26/2024 1545 by Val Violeta FALCON, RN Outcome: Progressing   Problem: Safety: Goal: Ability to remain free from injury will improve 02/26/2024 1545 by Val Violeta FALCON, RN Outcome: Not Progressing 02/26/2024 1545 by Val Violeta FALCON, RN Outcome: Progressing   Problem: Skin Integrity: Goal: Risk for impaired skin integrity will decrease 02/26/2024 1545 by Val Violeta FALCON, RN Outcome: Not Progressing 02/26/2024 1545 by Val Violeta FALCON, RN Outcome: Progressing

## 2024-02-26 NOTE — TOC CM/SW Note (Addendum)
 Transition of Care [facility]) - Inpatient Brief Assessment   Patient Details  Name: [name] MRN: [id number] Date of Birth: [birth year]  Transition of Care [facility]) CM/SW Contact:    [name], LCSWA Phone Number: 02/26/2024, 2:58 PM   Clinical Narrative:  2:58 PM CSW spoke with patient's assigned meredeth Cowing [phone]). Balambao provided CSW with patient's brother's contact information, Denvil Canning [phone]). CSW spoke with Carlin who declined to participate in medical decision making on behalf of the patient and did not know if patient had any children. CSW informed Balambao and medical team. CSW provided Balambao with patient's APS social worker and sister's contact information.CSW left secure voicemail for APS social worker regarding patient's brother. Detective informed CSW that patient's sister does not know the name of the son she believes patient has. CSW will continue to follow  Transition of Care Asessment: Insurance and Status: Insurance coverage has been reviewed Patient has primary care physician: Yes Home environment has been reviewed: N/A Prior level of function:: N/A Prior/Current Home Services: No current home services Social Drivers of Health Review:  (Patient is currently intubated) Readmission risk has been reviewed: Yes (Currently Yellow 17%) Transition of care needs: transition of care needs identified, TOC will continue to follow

## 2024-02-26 NOTE — TOC Progression Note (Signed)
 Transition of Care [facility]) - Progression Note    Patient Details  Name: [name] MRN: [id number] Date of Birth: 1961/01/08  Transition of Care [facility]) CM/SW Contact  Sherline Clack, [place] Phone Number: 02/26/2024, 11:54 AM  Clinical Narrative:     CSW left voicemail for APS social worker [name] [phone]) with updated details of following CSW's conversation with the patient's sister, [name]. [name] left a VM in return thanking CSW for the updated information. APS worker [name] does not have questions at this time and will reach out to CSW with any concerns or updates.                      Expected Discharge Plan and Services                                               Social Drivers of Health (SDOH) Interventions SDOH Screenings   Food Insecurity: Food Insecurity Present (01/30/2024)  Housing: High Risk (01/30/2024)  Transportation Needs: Patient Declined (01/30/2024)  Utilities: At Risk (01/30/2024)  Tobacco Use: High Risk (02/25/2024)    Readmission Risk Interventions     No data to display

## 2024-02-26 NOTE — Progress Notes (Signed)
 NAME:  [name], MRN:  [id number], DOB:  1960-05-31, LOS: 7 ADMISSION DATE:  02/19/2024, CONSULTATION DATE:  02/19/2024 REFERRING MD:  Lonni Sakai, MD, CHIEF COMPLAINT:  cardiac arrest  History of Present Illness:  63 y/o male with PMH for CAD who had a PCI in 2010 for anterior STEMI and found to have 3 vessel disease and went for CABG in 2010 (LIMA-LAD, SVG-Ramus, SVG -OM, SVG-RPDA), HFrEF, Ischemic Cardiomyopathy-last echo 2011 showing EF 25-30%, with akinesis to dyskinesis of the entire anterior, anteroseptal, and apical myocardium, and cocaine use disorder who was [facility] with unknown down time and CPR initiated 1655.  Patient noted to be in V fib arrest.  He was shocked approximately 12 times by EMS, IO placed and 450mg  Amiodarone, 6mg  Epinephrine, 1 liter IVF, 2gm Magnesium.  In ED patient on Levophed drip at 15mcg and Amiodarone drip.  He was intubated and sedated.  Code STEMI called, ST elevations in anteriolat leads and I spoke with Dr End, there is a potential ICH and so there is hesitation to take patient to cath lab given Heparin during cath. LA 9.3, Cr 1.30, WBC 13.8, INR 1.5, Glucose 303.  CT head also showing a soft tissue mass in the left maxillary sinus. Pertinent  Medical History  CAD who had a PCI in 2010 for anterior STEMI and found to have 3 vessel disease and went for CABG in 2010 (LIMA-LAD, SVG-Ramus, SVG -OM, SVG-RPDA), HFrEF, Ischemic Cardiomyopahty-last echo 2011 showing EF 25-30%, with akinesis to dyskinesis of the entire anterior, anteroseptal, and apical myocardium, and cocaine use disorder   Significant Hospital Events: Including procedures, antibiotic start and stop dates in addition to other pertinent events   10/24: Admit to ICU 10.28 MRI with anoxic brain injury   Interim History / Subjective:  Overnight no acute events. Tmax 100.6. No longer on arctic sun. Objective    Blood pressure 108/64,  pulse 91, temperature 99.9 F (37.7 C), resp. rate 20, height 6' 2.02 (1.88 m), weight 81 kg, SpO2 97%.    Vent Mode: PRVC FiO2 (%):  [40 %] 40 % Set Rate:  [15 bmp] 15 bmp Vt Set:  [650 mL] 650 mL PEEP:  [5 cmH20] 5 cmH20 Pressure Support:  [3 cmH20-5 cmH20] 3 cmH20 Plateau Pressure:  [11 cmH20] 11 cmH20   Intake/Output Summary (Last 24 hours) at 02/26/2024 0751 Last data filed at 02/26/2024 0700 Gross per 24 hour  Intake 2435.87 ml  Output 2425 ml  Net 10.87 ml   Filed Weights   02/24/24 0500 02/25/24 0345 02/26/24 0415  Weight: 81.8 kg 80.4 kg 81 kg    Examination: General: critically ill appearing man lying in bed in NAD HEENT: forehead lacerations stable, eyes anicteric, ETT Neuro: Eyes open, not tracking, no response to verbal stimulation. Posturing UE to noxious stimulation, no withdrawal from pain x 4 extremities.  Chest: breathing comfortably on MV, CTAB Heart: S1S2, RRR Abdomen: soft, NT MSK: No significant peripheral edema  BUN 41 Cr 1.13  Resolved problem list  Acute high anion gap metabolic acidosis/lactic acidosis Lactic acidosis Hypokalemia Cardiogenic shock Thrombocytopenia, possibly due to antibiotics Acute kidney injury due to ischemic ATN postcardiac arrest  Assessment and Plan  Acute anterio-lateral STEMI, S/p out-of-hospital V-fib cardiac arrest Acute on chronic HFrEF due to ischemic cardiomyopathy   New onset paroxysmal Afib; NSR today -con't DAPT. Hold metoprolol with low EF and recent shock -heparin gtt for Afib -amioarone load 200mg  bid -  monitor electrolytes, replete as needed -lasix 40mg  IV  Acute respiratory failure with hypoxia on MV Probable aspiration pneumonia-- polymicrobial- citrobacter, MSSA, K pneumoniae -LTVV -VAP prevention protocol -PAD protocol for sedation-- trying to minimize as much as able. Mostly needing for shivering.  -daily SAT & SBT as appropriate; mental status precludes extubation unless for palliative  intent -meropenem for pneumonia per ID recs, 7 days  Acute encephalopathy, anoxic brain injury confirmed on MRI Myoclonic seizures -keppra -sedation PRN for shivering or discomfort; otherwise trying to minimize sedation while he is still full scope  Hypoglycemia; resolved, now hyperglycemic -TF -not adding insulin unless BG continues to trend up  Hypokalemia, resolved -replete, monitor  Hypocalcemia -TF, monitor  Chronic subdural hygroma.  Subdural hemorrhage was ruled out Left maxillary sinus mass -OP follow up as needed  Forehead ecchymoses, abrasions -supportive care -can remove staples tomorrow  Updated 2 friends from the church associated with a previous homeless shelter where he had stayed. They feel they know what his wishes would be in this scenario, and I updated them that we are working through who can make these decisions on his behalf.   Labs   CBC: Recent Labs  Lab 02/21/24 0212 02/21/24 1057 02/22/24 1004 02/23/24 0400 02/24/24 0231 02/25/24 0042  WBC 23.6*  --  11.7* 12.2* 13.9* 12.7*  HGB 17.0 15.3 14.2 12.5* 13.0 13.4  HCT 51.5 45.0 40.9 36.2* 38.1* 38.3*  MCV 88.8  --  86.3 87.0 87.8 86.3  PLT 220  --  146* 148* 145* 264    Basic Metabolic Panel: Recent Labs  Lab 02/22/24 1700 02/23/24 0400 02/23/24 1620 02/24/24 0231 02/25/24 0240 02/26/24 0623  NA  --  132* 134* 134* 141 147*  K  --  3.3* 3.8 4.3 4.0 3.2*  CL  --  96* 97* 97* 98 103  CO2  --  25 27 28 31 30   GLUCOSE  --  251* 136* 122* 136* 135*  BUN  --  50* 46* 45* 47* 41*  CREATININE  --  1.53* 1.44* 1.21 1.03 1.13  CALCIUM  --  7.6* 7.6* 7.8* 7.9* 7.4*  MG 2.0 2.2 2.3 2.3 2.4 2.2  PHOS 4.5 3.7 3.2 2.9  3.0 3.1  --       This patient is critically ill with multiple organ system failure which requires frequent high complexity decision making, assessment, support, evaluation, and titration of therapies. This was completed through the application of advanced monitoring  technologies and extensive interpretation of multiple databases. During this encounter critical care time was devoted to patient care services described in this note for 34 minutes.  Leita SHAUNNA Gaskins, DO 02/26/24 3:41 PM [facility]  For contact information, see Amion. If no response to pager, please call PCCM consult pager. After hours, 7PM- 7AM, please call Elink.

## 2024-02-26 NOTE — Progress Notes (Signed)
 Pharmacy Electrolyte Replacement  Recent Labs:  Recent Labs    02/25/24 0240 02/26/24 0623  K 4.0 3.2*  MG 2.4 2.2  PHOS 3.1  --   CREATININE 1.03 1.13    Low Critical Values (K </= 2.5, Phos </= 1, Mg </= 1) Present: None  MD Contacted: Dr. Gretta  Patient receiving Lasix today - giving more than protocol  Plan: KCl 40mEq per tube x3 doses.  Repeat labs in AM  Harlene Boga, PharmD, BCPS, BCCCP Clinical Pharmacist Please refer to [facility] for [facility] Pharmacy numbers 02/26/2024, 8:56 AM

## 2024-02-26 NOTE — Plan of Care (Signed)

## 2024-02-27 DIAGNOSIS — J9601 Acute respiratory failure with hypoxia: Secondary | ICD-10-CM | POA: Diagnosis not present

## 2024-02-27 DIAGNOSIS — Z711 Person with feared health complaint in whom no diagnosis is made: Secondary | ICD-10-CM | POA: Diagnosis not present

## 2024-02-27 DIAGNOSIS — I469 Cardiac arrest, cause unspecified: Secondary | ICD-10-CM | POA: Diagnosis not present

## 2024-02-27 DIAGNOSIS — S0990XA Unspecified injury of head, initial encounter: Secondary | ICD-10-CM | POA: Diagnosis not present

## 2024-02-27 DIAGNOSIS — G931 Anoxic brain damage, not elsewhere classified: Secondary | ICD-10-CM | POA: Diagnosis not present

## 2024-02-27 DIAGNOSIS — I480 Paroxysmal atrial fibrillation: Secondary | ICD-10-CM | POA: Diagnosis not present

## 2024-02-27 DIAGNOSIS — Z515 Encounter for palliative care: Secondary | ICD-10-CM | POA: Diagnosis not present

## 2024-02-27 LAB — BASIC METABOLIC PANEL WITH GFR
Anion gap: 14 (ref 5–15)
BUN: 33 mg/dL — ABNORMAL HIGH (ref 8–23)
CO2: 27 mmol/L (ref 22–32)
Calcium: 8.1 mg/dL — ABNORMAL LOW (ref 8.9–10.3)
Chloride: 110 mmol/L (ref 98–111)
Creatinine, Ser: 0.95 mg/dL (ref 0.61–1.24)
GFR, Estimated: >60 mL/min (ref >60)
Glucose, Bld: 167 mg/dL — ABNORMAL HIGH (ref 70–99)
Potassium: 4.2 mmol/L (ref 3.5–5.1)
Sodium: 151 mmol/L — ABNORMAL HIGH (ref 135–145)

## 2024-02-27 LAB — GLUCOSE, CAPILLARY
Glucose-Capillary: 122 mg/dL — ABNORMAL HIGH (ref 70–99)
Glucose-Capillary: 129 mg/dL — ABNORMAL HIGH (ref 70–99)
Glucose-Capillary: 158 mg/dL — ABNORMAL HIGH (ref 70–99)
Glucose-Capillary: 153 mg/dL — ABNORMAL HIGH (ref 70–99)
Glucose-Capillary: 173 mg/dL — ABNORMAL HIGH (ref 70–99)

## 2024-02-27 MED ORDER — BROMOCRIPTINE MESYLATE 2.5 MG PO TABS
2.5000 mg | ORAL_TABLET | Freq: Three times a day (TID) | ORAL | Status: DC
  Administered 2024-02-27 – 2024-02-28 (×5): 2.5 mg
  Filled 2024-02-27 (×6): qty 1

## 2024-02-27 NOTE — Progress Notes (Signed)
 RT note. Patient placed back on PRVC at this time due to ^ WOB/ RR 30/ accessory muscle use.  RT will continue to monitor.    02/27/24 1758  Vent Select  Invasive or Noninvasive Invasive  Adult Vent Y  Airway 7 mm  Placement Date/Time: 02/19/24 (c) 1745   Inserted prior to hospital arrival?: Placed by EMS/Paramedic  Placed By: EMS  Airway Device: Endotracheal Tube  ETT Types: Oral  Size (mm): 7 mm  Cuffed: Cuffed  Secured at (cm): 23 cm  Secured at (cm) 23 cm  Measured From Lips  Secured Location Right  Secured By English As A Second Language Teacher Yes  Tube Holder Repositioned Yes  Prone position No  Cuff Pressure (cm H2O) Clear OR 27-39 CmH2O  Site Condition Drainage (Comment) (oral secretions)  Adult Ventilator Settings  Vent Type Servo i  Humidity HME  Vent Mode (S)  PRVC (labored breathing)  Vt Set 650 mL  Set Rate 15 bmp  FiO2 (%) 40 %  I Time 0.9 Sec(s)  PEEP 5 cmH20  Adult Ventilator Measurements  Peak Airway Pressure 24 L/min  Mean Airway Pressure 13 cmH20  Resp Rate Spontaneous 15 br/min  Resp Rate Total 30 br/min  Exhaled Vt 656 mL  Measured Ve 19 L  I:E Ratio Measured 1:1.3  Auto PEEP 0 cmH20  Total PEEP 5 cmH20  SpO2 92 %

## 2024-02-27 NOTE — Plan of Care (Signed)

## 2024-02-27 NOTE — Progress Notes (Signed)
 NAME:  HONEST VANLEER, MRN:  [id number], DOB:  July 24, 1960, LOS: 8 ADMISSION DATE:  02/19/2024, CONSULTATION DATE:  02/19/2024 REFERRING MD:  Lonni Sakai, MD, CHIEF COMPLAINT:  cardiac arrest  History of Present Illness:  63 y/o male with PMH for CAD who had a PCI in 2010 for anterior STEMI and found to have 3 vessel disease and went for CABG in 2010 (LIMA-LAD, SVG-Ramus, SVG -OM, SVG-RPDA), HFrEF, Ischemic Cardiomyopathy-last echo 2011 showing EF 25-30%, with akinesis to dyskinesis of the entire anterior, anteroseptal, and apical myocardium, and cocaine use disorder who [facility] with unknown down time and CPR initiated 1655.  Patient noted to be in V fib arrest.  He was shocked approximately 12 times by EMS, IO placed and 450mg  Amiodarone, 6mg  Epinephrine, 1 liter IVF, 2gm Magnesium.  In ED patient on Levophed drip at 15mcg and Amiodarone drip.  He was intubated and sedated.  Code STEMI called, ST elevations in anteriolat leads and I spoke with Dr End, there is a potential ICH and so there is hesitation to take patient to cath lab given Heparin during cath. LA 9.3, Cr 1.30, WBC 13.8, INR 1.5, Glucose 303.  CT head also showing a soft tissue mass in the left maxillary sinus. Pertinent  Medical History  CAD who had a PCI in 2010 for anterior STEMI and found to have 3 vessel disease and went for CABG in 2010 (LIMA-LAD, SVG-Ramus, SVG -OM, SVG-RPDA), HFrEF, Ischemic Cardiomyopahty-last echo 2011 showing EF 25-30%, with akinesis to dyskinesis of the entire anterior, anteroseptal, and apical myocardium, and cocaine use disorder   Significant Hospital Events: Including procedures, antibiotic start and stop dates in addition to other pertinent events   10/24: Admit to ICU 10.28 MRI with anoxic brain injury   Interim History / Subjective:  Tmax 100.6. No changes overnight.   Objective    Blood pressure 128/76, pulse 99, temperature 99.5 F  (37.5 C), resp. rate 18, height 6' 2.02 (1.88 m), weight 82 kg, SpO2 99%.    Vent Mode: PRVC FiO2 (%):  [40 %] 40 % Set Rate:  [15 bmp] 15 bmp Vt Set:  [650 mL] 650 mL PEEP:  [5 cmH20] 5 cmH20 Pressure Support:  [10 cmH20] 10 cmH20 Plateau Pressure:  [17 cmH20-20 cmH20] 20 cmH20   Intake/Output Summary (Last 24 hours) at 02/27/2024 0734 Last data filed at 02/27/2024 0700 Gross per 24 hour  Intake 3037.04 ml  Output 2655 ml  Net 382.04 ml   Filed Weights   02/25/24 0345 02/26/24 0415 02/27/24 0446  Weight: 80.4 kg 81 kg 82 kg    Examination: General: critically ill appearing man lying in bed intubated, sedated HEENT: forehead lacerations Neuro: Eyes slightly open, pupils reactive, no gag or cough. No response to pain. Chest:  breathing over the vent, minimal ETT secretions Heart: S1S2, RRR Abdomen: soft, NT MSK: no significant peripheral edema  BUN 33 Cr 0.95  Resolved problem list  Acute high  gap metabolic acidosis/lactic acidosis Lactic acidosis Hypokalemia Hypocalcemia Cardiogenic shock Thrombocytopenia, possibly due to antibiotics Acute kidney injury due to ischemic ATN postcardiac arrest  Assessment and Plan  Acute anterio-lateral STEMI, S/p out-of-hospital V-fib cardiac arrest Acute on chronic HFrEF due to ischemic cardiomyopathy   New onset paroxysmal Afib; NSR today -DAPT -heparin for Afib -con't amiodarone -hold lasix today  Acute respiratory failure with hypoxia on MV Probable aspiration pneumonia-- polymicrobial- citrobacter, MSSA, K pneumoniae -LTVV -VAP prevention protocol -PAD protocol  for sedation; trying to minimize sedation as much as possible to help family if we are able to find family who wants to participate in his care. Has needed precedex for shivering.  -VAP prevention protocol -con't vent weaning efforts but extubation would be inappropriate with current mental status unless for palliation. -meropenem for pneumonia per ID recs x 7  days  Acute encephalopathy, anoxic brain injury confirmed on MRI Myoclonic seizures -con't keppra -sedation PRN; prefer shorter acting meds as able.   Hypoglycemia; resolved, now hyperglycemic -TF -with previous hypogylcemia, trying to avoid insulin unless going up significantly.   Chronic subdural hygroma.  Subdural hemorrhage was ruled out Left maxillary sinus mass -OP follow up as needed  Forehead ecchymoses, abrasions -d/c staples today  Fevers, suspect central -trial of bromocriptine  No family at bedside today. I appreciate SW and Palliative care's attempts at contacting family. Multiple members of the church community who know him have been involved this week and would be willing to be surrogate decision makers if needed. Trying to get in touch with a sister; one previous sister and brother have said they do not want to participate in his care.   Labs   CBC: Recent Labs  Lab 02/21/24 0212 02/21/24 1057 02/22/24 1004 02/23/24 0400 02/24/24 0231 02/25/24 0042  WBC 23.6*  --  11.7* 12.2* 13.9* 12.7*  HGB 17.0 15.3 14.2 12.5* 13.0 13.4  HCT 51.5 45.0 40.9 36.2* 38.1* 38.3*  MCV 88.8  --  86.3 87.0 87.8 86.3  PLT 220  --  146* 148* 145* 264    Basic Metabolic Panel: Recent Labs  Lab 02/22/24 1700 02/23/24 0400 02/23/24 1620 02/24/24 0231 02/25/24 0240 02/26/24 0623  NA  --  132* 134* 134* 141 147*  K  --  3.3* 3.8 4.3 4.0 3.2*  CL  --  96* 97* 97* 98 103  CO2  --  25 27 28 31 30   GLUCOSE  --  251* 136* 122* 136* 135*  BUN  --  50* 46* 45* 47* 41*  CREATININE  --  1.53* 1.44* 1.21 1.03 1.13  CALCIUM  --  7.6* 7.6* 7.8* 7.9* 7.4*  MG 2.0 2.2 2.3 2.3 2.4 2.2  PHOS 4.5 3.7 3.2 2.9  3.0 3.1  --       This patient is critically ill with multiple organ system failure which requires frequent high complexity decision making, assessment, support, evaluation, and titration of therapies. This was completed through the application of advanced monitoring  technologies and extensive interpretation of multiple databases. During this encounter critical care time was devoted to patient care services described in this note for 36 minutes.  Leita SHAUNNA Gaskins, DO 02/27/24 12:45 PM [facility]  For contact information, see Amion. If no response to pager, please call PCCM consult pager. After hours, 7PM- 7AM, please call Elink.

## 2024-02-27 NOTE — Progress Notes (Signed)
 RT note. Patient placed on SBT with the following settings, no labored breathing noted at this time, sat 99%. RT will continue to monitor.    02/27/24 0805  Vent Select  Invasive or Noninvasive Invasive  Adult Vent Y  Airway 7 mm  Placement Date/Time: 02/19/24 (c) 1745   Inserted prior to hospital arrival?: Placed by EMS/Paramedic  Placed By: EMS  Airway Device: Endotracheal Tube  ETT Types: Oral  Size (mm): 7 mm  Cuffed: Cuffed  Secured at (cm): 23 cm  Secured at (cm) 23 cm  Measured From Lips  Secured Location Right  Secured By English As A Second Language Teacher Yes  Tube Holder Repositioned Yes  Prone position No  Cuff Pressure (cm H2O) Clear OR 27-39 CmH2O  Site Condition Dry  Adult Ventilator Settings  Vent Type Servo i  Humidity HME  Vent Mode (S)  PSV;CPAP  FiO2 (%) 40 %  Pressure Support 5 cmH20  PEEP 5 cmH20  Adult Ventilator Measurements  Peak Airway Pressure 11 L/min  Mean Airway Pressure 7 cmH20  Resp Rate Spontaneous 22 br/min  Resp Rate Total 22 br/min  Spont TV 586 mL  Measured Ve 12 L  Auto PEEP 0 cmH20  Total PEEP 5 cmH20  SpO2 99 %  Adult Ventilator Alarms  Alarms On Y  Ve High Alarm 21 L/min  Ve Low Alarm 4 L/min  Resp Rate High Alarm 38 br/min  Resp Rate Low Alarm 10  PEEP Low Alarm 3 cmH2O  Press High Alarm 40 cmH2O  T Apnea 20 sec(s)  VAP Prevention  HOB> 30 Degrees Y  Equipment wiped down Yes  Daily Weaning Assessment  Daily Assessment of Readiness to Wean Wean protocol criteria met (SBT performed)  SBT Method CPAP 5 cm H20 and PS 5 cm H20  Weaning Start Time 0805  Breath Sounds  Bilateral Breath Sounds Clear;Diminished

## 2024-02-27 NOTE — Progress Notes (Signed)
 Daily Progress Note   Patient Name: [name]       Date: 02/27/2024 DOB: 31-May-1960  Age: 63 y.o. MRN#: [id number] Attending Physician: Gretta Leita SQUIBB, DO Primary Care Physician: Scarlett Ronal Caldron, NP Admit Date: 02/19/2024  Reason for Consultation/Follow-up: Establishing goals of care  Subjective: Received notification that [name] called PMT phone and left a voicemail after service hours.  I have reviewed medical records including EPIC notes, MAR, and labs. Received report from primary RN -no acute concerns.  RN reports no significant changes from yesterday.  Per RN patient blinks but is otherwise unresponsive.  Went to visit patient at bedside-no family/visitors present.  Patient was lying in bed.  Primary RN and NT at bedside providing care.  As previously noted by RN, patient's eyes are open and he blinks but is otherwise nonresponsive to care.  No respiratory distress, increased work of breathing, or secretions noted.  He remains intubated -Vent weaning efforts ongoing.  Discussed case with Dr. Gretta.  Per Lauraine HUGHS, she has been in communication with detective and attempts to locate any of patient's next of kin.  She was able able to speak with patient's brother/[name]; however, he was not willing to act as surrogate decision maker.  He is not aware that patient has any children.  Please see her note for further details.  Due to inability to locate next of kin who are willing to act as surrogate decision maker for patient, will continue goals of care discussions with the individuals that have an established relationship with the patient who are acting in good faith -[name] in addition to [name] Carr per his request.   12:14 PM Attempted to call friend/[name] to  discuss diagnosis, prognosis, GOC, EOL wishes, disposition, and options specific to DNR and one-way extubation Monday - no answer - confidential voicemail left and PMT phone number provided with request to return call.   Of note this is day 3 at unsuccessful attempts to contact [name] though she did return PMT call last night. If unable to speak with her by tomorrow 11/2, will plan to continue goals of care discussions with Carrol Ghazi and his thoughts on proceeding with DNR and one-way extubation Monday without [name]'s input.   Length of Stay: 8  Current Medications: Scheduled Meds:   amiodarone  200 mg Per Tube BID   aspirin  81 mg Per Tube Daily   atorvastatin  40 mg Per Tube Daily   bromocriptine  2.5 mg Per Tube TID   busPIRone  10 mg Per Tube TID   Chlorhexidine Gluconate Cloth  6 each Topical Daily   clopidogrel  75 mg Per Tube Daily   docusate  100 mg Per Tube BID   feeding supplement (PROSource TF20)  60 mL Per Tube Daily   free water  200 mL Per Tube Q4H   heparin injection (subcutaneous)  5,000 Units Subcutaneous Q8H   levETIRAcetam  500 mg Intravenous Q12H   mouth rinse  15 mL Mouth Rinse Q2H   pantoprazole (PROTONIX) IV  40 mg Intravenous QHS   polyethylene glycol  17 g Per Tube Daily   sodium chloride flush  10-40 mL Intracatheter Q12H    Continuous Infusions:  dexmedetomidine (PRECEDEX) IV infusion Stopped (02/26/24 0920)   feeding supplement (VITAL 1.5 CAL) 60 mL/hr at 02/27/24 0700   meropenem (MERREM) IV Stopped (02/27/24 0554)    PRN Meds: acetaminophen  **OR** acetaminophen  (TYLENOL ) oral liquid 160 mg/5 mL **OR** acetaminophen , docusate, fentaNYL (SUBLIMAZE) injection, fentaNYL (SUBLIMAZE) injection, ondansetron (ZOFRAN) IV, mouth rinse, polyethylene glycol, sodium chloride flush  Physical Exam Vitals and nursing note reviewed.  Constitutional:      General: He is not in acute distress.    Appearance: He is cachectic. He is ill-appearing.  Pulmonary:      Effort: No respiratory distress.  Skin:    General: Skin is cool and dry.             Vital Signs: BP 122/76   Pulse (!) 109   Temp (!) 100.4 F (38 C)   Resp (!) 23   Ht 6' 2.02 (1.88 m)   Wt 82 kg   SpO2 99%   BMI 23.20 kg/m  SpO2: SpO2: 99 % O2 Device: O2 Device: Ventilator O2 Flow Rate:    Intake/output summary:  Intake/Output Summary (Last 24 hours) at 02/27/2024 1213 Last data filed at 02/27/2024 1000 Gross per 24 hour  Intake 2737 ml  Output 2190 ml  Net 547 ml   LBM: Last BM Date : 02/26/24 Baseline Weight: Weight: 70 kg Most recent weight: Weight: 82 kg       Palliative Assessment/Data: PPS 30% with tube feeds      Patient Active Problem List   Diagnosis Date Noted   Acute HFrEF (heart failure with reduced ejection fraction) (HCC) 02/26/2024   Malnutrition of moderate degree 02/23/2024   On mechanically assisted ventilation (HCC) 02/22/2024   AKI (acute kidney injury) 02/22/2024   Paroxysmal atrial fibrillation (HCC) 02/22/2024   Acute respiratory failure with hypoxia (HCC) 02/21/2024   Cardiac arrest (HCC) 02/19/2024    Palliative Care Assessment & Plan   Patient Profile: 63 y.o. male with past medical history of CAD s/p PCI 2010 for anterior STEMI, 3 vessel disease s/p CABG 2010, HFrEF 25-30% 2011, ICM, cocaine use admitted on 02/19/2024 with unknown downtime found in Vfib arrest. MRI consistent with anoxic brain injury. Prognosis is poor.   Assessment: Principal Problem:   Cardiac arrest [facility]) Active Problems:   Acute respiratory failure with hypoxia (HCC)   On mechanically assisted ventilation (HCC)   AKI (acute kidney injury)   Paroxysmal atrial fibrillation (HCC)   Malnutrition of moderate degree   Acute HFrEF (heart failure with reduced ejection fraction) (HCC)   Concern about end-of-life  Recommendations/Plan: Continue full code/full scope  Continue efforts to locate family Decisions to be made by friendly connections [name] and Raphael Myron.  Unfortunately, this is day three at attempts to contact [name] without success though she did return PMT call last night. If unable to speak with her by tomorrow 11/2, will discuss with Layman proceeding with DNR and one way extubation Monday without [name]'s input PMT will continue to follow and support holistically  Goals of Care and Additional Recommendations: Limitations on Scope of Treatment: Full Scope Treatment  Code Status:    Code Status Orders  (From admission, onward)           Start     Ordered   02/19/24 2012  Full code  Continuous       Question:  By:  Answer:  Default: patient does not have capacity for decision making, no surrogate or prior directive available   02/19/24 2013           Code Status History     This patient has a current code status but no historical code status.       Prognosis:  poor  Discharge Planning: To Be Determined  Care plan was discussed with primary RN, Dr. Gretta  Thank you for allowing the Palliative Medicine Team to assist in the care of this patient.   Total Time 35 minutes Prolonged Time Billed  no       Jeoffrey CHRISTELLA Sharps, NP  Please contact Palliative Medicine Team phone at [phone] for questions and concerns.   *Portions of this note are a verbal dictation therefore any spelling and/or grammatical errors are due to the Dragon Medical One system interpretation.

## 2024-02-27 DEATH — deceased

## 2024-02-28 DIAGNOSIS — Z515 Encounter for palliative care: Secondary | ICD-10-CM | POA: Diagnosis not present

## 2024-02-28 DIAGNOSIS — Z7189 Other specified counseling: Secondary | ICD-10-CM | POA: Diagnosis not present

## 2024-02-28 DIAGNOSIS — G931 Anoxic brain damage, not elsewhere classified: Secondary | ICD-10-CM | POA: Diagnosis not present

## 2024-02-28 DIAGNOSIS — I469 Cardiac arrest, cause unspecified: Secondary | ICD-10-CM | POA: Diagnosis not present

## 2024-02-28 DIAGNOSIS — J9601 Acute respiratory failure with hypoxia: Secondary | ICD-10-CM | POA: Diagnosis not present

## 2024-02-28 DIAGNOSIS — I480 Paroxysmal atrial fibrillation: Secondary | ICD-10-CM | POA: Diagnosis not present

## 2024-02-28 LAB — GLUCOSE, CAPILLARY
Glucose-Capillary: 158 mg/dL — ABNORMAL HIGH (ref 70–99)
Glucose-Capillary: 160 mg/dL — ABNORMAL HIGH (ref 70–99)
Glucose-Capillary: 126 mg/dL — ABNORMAL HIGH (ref 70–99)
Glucose-Capillary: 146 mg/dL — ABNORMAL HIGH (ref 70–99)

## 2024-02-28 MED ORDER — MORPHINE 100MG IN NS 100ML (1MG/ML) PREMIX INFUSION: 2.0000 mg/h | INTRAVENOUS | Status: DC

## 2024-02-28 MED ORDER — MORPHINE 100MG IN NS 100ML (1MG/ML) PREMIX INFUSION
6.0000 mg/h | INTRAVENOUS | Status: DC
  Administered 2024-02-29: 2 mg/h via INTRAVENOUS
  Administered 2024-03-01: 4 mg/h via INTRAVENOUS
  Filled 2024-02-28 (×2): qty 100

## 2024-02-28 MED ORDER — MORPHINE BOLUS VIA INFUSION: 2.0000 mg | INTRAVENOUS | Status: DC | PRN

## 2024-02-28 MED ORDER — GLYCOPYRROLATE 0.2 MG/ML IJ SOLN
0.4000 mg | Freq: Three times a day (TID) | INTRAMUSCULAR | Status: DC
  Administered 2024-02-28 – 2024-02-29 (×3): 0.4 mg via INTRAVENOUS
  Filled 2024-02-28 (×3): qty 2

## 2024-02-28 NOTE — Plan of Care (Signed)
  Problem: Metabolic: Goal: Ability to maintain appropriate glucose levels will improve Outcome: Progressing   Problem: Nutritional: Goal: Maintenance of adequate nutrition will improve Outcome: Progressing Goal: Progress toward achieving an optimal weight will improve Outcome: Progressing   Problem: Skin Integrity: Goal: Risk for impaired skin integrity will decrease Outcome: Progressing   Problem: Tissue Perfusion: Goal: Adequacy of tissue perfusion will improve Outcome: Progressing   Problem: Clinical Measurements: Goal: Will remain free from infection Outcome: Progressing Goal: Diagnostic test results will improve Outcome: Progressing Goal: Respiratory complications will improve Outcome: Progressing Goal: Cardiovascular complication will be avoided Outcome: Progressing   Problem: Activity: Goal: Risk for activity intolerance will decrease Outcome: Progressing   Problem: Nutrition: Goal: Adequate nutrition will be maintained Outcome: Progressing   Problem: Coping: Goal: Level of anxiety will decrease Outcome: Progressing   Problem: Elimination: Goal: Will not experience complications related to bowel motility Outcome: Progressing Goal: Will not experience complications related to urinary retention Outcome: Progressing   Problem: Pain Managment: Goal: General experience of comfort will improve and/or be controlled Outcome: Progressing   Problem: Safety: Goal: Ability to remain free from injury will improve Outcome: Progressing   Problem: Skin Integrity: Goal: Risk for impaired skin integrity will decrease Outcome: Progressing

## 2024-02-28 NOTE — Progress Notes (Signed)
 NAME:  [name], MRN:  [id number], DOB:  1960/05/19, LOS: 9 ADMISSION DATE:  02/19/2024, CONSULTATION DATE:  02/19/2024 REFERRING MD:  Lonni Sakai, MD, CHIEF COMPLAINT:  cardiac arrest  History of Present Illness:  63 y/o male with PMH for CAD who had a PCI in 2010 for anterior STEMI and found to have 3 vessel disease and went for CABG in 2010 (LIMA-LAD, SVG-Ramus, SVG -OM, SVG-RPDA), HFrEF, Ischemic Cardiomyopathy-last echo 2011 showing EF 25-30%, with akinesis to dyskinesis of the entire anterior, anteroseptal, and apical myocardium, and cocaine use disorder who was found down outside [facility] with unknown down time and CPR initiated 1655.  Patient noted to be in V fib arrest.  He was shocked approximately 12 times by EMS, IO placed and 450mg  Amiodarone, 6mg  Epinephrine, 1 liter IVF, 2gm Magnesium.  In ED patient on Levophed drip at 15mcg and Amiodarone drip.  He was intubated and sedated.  Code STEMI called, ST elevations in anteriolat leads and I spoke with Dr End, there is a potential ICH and so there is hesitation to take patient to cath lab given Heparin during cath. LA 9.3, Cr 1.30, WBC 13.8, INR 1.5, Glucose 303.  CT head also showing a soft tissue mass in the left maxillary sinus. Pertinent  Medical History  CAD who had a PCI in 2010 for anterior STEMI and found to have 3 vessel disease and went for CABG in 2010 (LIMA-LAD, SVG-Ramus, SVG -OM, SVG-RPDA), HFrEF, Ischemic Cardiomyopahty-last echo 2011 showing EF 25-30%, with akinesis to dyskinesis of the entire anterior, anteroseptal, and apical myocardium, and cocaine use disorder   Significant Hospital Events: Including procedures, antibiotic start and stop dates in addition to other pertinent events   10/24: Admit to ICU 10.28 MRI with anoxic brain injury   Interim History / Subjective:  Tmax 100.4.   Objective    Blood pressure 125/77, pulse (!) 108, temperature (!) 100.4 F (38 C),  resp. rate (!) 25, height 6' 2.02 (1.88 m), weight 82.3 kg, SpO2 100%.    Vent Mode: PRVC FiO2 (%):  [40 %] 40 % Set Rate:  [15 bmp] 15 bmp Vt Set:  [650 mL] 650 mL PEEP:  [5 cmH20] 5 cmH20 Pressure Support:  [5 cmH20] 5 cmH20 Plateau Pressure:  [18 cmH20-20 cmH20] 20 cmH20   Intake/Output Summary (Last 24 hours) at 02/28/2024 1320 Last data filed at 02/28/2024 1225 Gross per 24 hour  Intake 2854 ml  Output 2150 ml  Net 704 ml   Filed Weights   02/26/24 0415 02/27/24 0446 02/28/24 0403  Weight: 81 kg 82 kg 82.3 kg    Examination: General: critically ill appearing man lying in bed in NAD HEENT: head lacerations, ETT Neuro: not following commands, minimal eye opening, no withdrawal from pain, no gag. Chest:  breathing comfortably on MV, CTAB Heart: S1S2, RRR Abdomen: soft, NT MSK: no significant peripheral edema, no cyanosis or edema  BUN 33 Cr 0.95  Resolved problem list  Acute high  gap metabolic acidosis/lactic acidosis Lactic acidosis Hypokalemia Hypocalcemia Cardiogenic shock Thrombocytopenia, possibly due to antibiotics Acute kidney injury due to ischemic ATN postcardiac arrest  Assessment and Plan  Acute anterio-lateral STEMI, S/p out-of-hospital V-fib cardiac arrest Acute on chronic HFrEF due to ischemic cardiomyopathy   New onset paroxysmal Afib; NSR today -DAPT -heparin for Afib -amiodarone -no additional lasix needed today  Acute respiratory failure with hypoxia on MV Probable aspiration pneumonia-- polymicrobial- citrobacter, MSSA, K pneumoniae -LTVV -VAP prevention  protocol -PAD protocol -daily SAT & SBT as appropriate -con't vent weaning efforts -tentatively planning for terminal extubation tomorrow-- see PMT's notes -meropenem x7days  Acute encephalopathy, anoxic brain injury confirmed on MRI Myoclonic seizures -keppra -comfort meds as appropriate  Hypoglycemia; resolved, now hyperglycemic -TF -no insulin  Chronic subdural hygroma.   Subdural hemorrhage was ruled out Left maxillary sinus mass -OP follow up as needed  Forehead ecchymoses, abrasions -wound care  Fevers, suspect central -no response to bromocriptine -tylenol  PRN  Still no response from the final sister we are trying to get in touch with. Sister and brother previously have been contacted and do not want to be involved. Layman and [name] are staff/ clergy at a church that is affiliated with a homeless shelter where he had previously resided and feel that they knew him well and are willing to make decisions on his behalf. They are tentatively planning on coming to the hospital for a terminal extubation tomorrow based on their discussion with PMT.   Labs   CBC: Recent Labs  Lab 02/22/24 1004 02/23/24 0400 02/24/24 0231 02/25/24 0042  WBC 11.7* 12.2* 13.9* 12.7*  HGB 14.2 12.5* 13.0 13.4  HCT 40.9 36.2* 38.1* 38.3*  MCV 86.3 87.0 87.8 86.3  PLT 146* 148* 145* 264    Basic Metabolic Panel: Recent Labs  Lab 02/22/24 1700 02/23/24 0400 02/23/24 1620 02/24/24 0231 02/25/24 0240 02/26/24 0623 02/27/24 0656  NA  --  132* 134* 134* 141 147* 151*  K  --  3.3* 3.8 4.3 4.0 3.2* 4.2  CL  --  96* 97* 97* 98 103 110  CO2  --  25 27 28 31 30 27   GLUCOSE  --  251* 136* 122* 136* 135* 167*  BUN  --  50* 46* 45* 47* 41* 33*  CREATININE  --  1.53* 1.44* 1.21 1.03 1.13 0.95  CALCIUM  --  7.6* 7.6* 7.8* 7.9* 7.4* 8.1*  MG 2.0 2.2 2.3 2.3 2.4 2.2  --   PHOS 4.5 3.7 3.2 2.9  3.0 3.1  --   --      Leita SHAUNNA Gaskins, DO 02/28/24 5:40 PM [facility]  For contact information, see Amion. If no response to pager, please call PCCM consult pager. After hours, 7PM- 7AM, please call Elink.

## 2024-02-28 NOTE — Progress Notes (Signed)
 Palliative:  HPI: 63 y.o. male  with past medical history of CAD s/p PCI 2010 for anterior STEMI, 3 vessel disease s/p CABG 2010, HFrEF 25-30% 2011, ICM, cocaine use admitted on 02/19/2024 with unknown downtime found in Vfib arrest. MRI consistent with anoxic brain injury. Prognosis is poor.   I met today at [name]'s bedside. No visitors present. I discussed with RN. I discussed with Dr. Gretta. No changes in status and prognosis remains poor with no improvements in neurological status.   I was able to connect with friends [name] and [name]. They have been speaking together. They have good understanding of [name]'s situation and poor prognosis. They have very appropriate and insightful questions. They have been speaking together along with other people who care for [name] and would like to support him through end of life. We did confirm that there are 2 siblings both a sister and brother have been located and contacted both confirming with our CSW Lauraine that they do not wish to be involved in [name]'s care. I confirmed that in the absence of a family member that if willing to speak on his behalf that they are able to speak on his behalf with knowledge that they have an established relationship to act on behalf of [name]. They wanted to be sure that they are legally able to do this and I confirmed that we have clear documentation of our efforts and family that have been located have declined involvement. They understand and are willing to act on behalf of [name].   We discussed goals of care. They both agree and confirm they have also discussed with others that [name] would not want to be prolonged in this state if recovery is not expected. They do not want him to suffer. They do not want to prolong any suffering. They agree with DNR status. They would like to gather together at bedside tomorrow at 10:30 am to proceed with one way extubation to comfort. They will gather with friends of [name] to  offer presence during this transition. They understand that his prognosis may likely be days and the goal would be for him to rest comfortably during this time. They both agree with path forward. [name] asks about final arrangements and I shared that my understanding is the state does take care of this in the absence of person claiming the body. I assured her we can provide them with patient placement information to assist with plans for final arrangements if there is anyone able/willing to assist with this per [name]'s wishes.   All questions/concerns addressed. Emotional support provided. Updated Dr. Gretta, RN,.  Exam: Unresponsive. No withdrawal to pain. No purposeful or non-purposeful movement. Tolerating vent via ETT. Abd soft. + flexiseal. Warm to touch.   Plan: - DNR - Comfort care - Medication to ensure comfort to begin in preparation for one way extubation tomorrow - One way extubation planned for 10:30 am tomorrow  60 min  [name], NP Palliative Medicine Team Pager [phone] (Please see [email or web address] for schedule) Team Phone [phone]

## 2024-02-28 NOTE — Plan of Care (Signed)
  Problem: Skin Integrity: Goal: Risk for impaired skin integrity will decrease Outcome: Progressing   Problem: Clinical Measurements: Goal: Ability to maintain clinical measurements within normal limits will improve Outcome: Progressing

## 2024-02-29 DIAGNOSIS — J9601 Acute respiratory failure with hypoxia: Secondary | ICD-10-CM | POA: Diagnosis not present

## 2024-02-29 DIAGNOSIS — I469 Cardiac arrest, cause unspecified: Secondary | ICD-10-CM | POA: Diagnosis not present

## 2024-02-29 DIAGNOSIS — G931 Anoxic brain damage, not elsewhere classified: Secondary | ICD-10-CM | POA: Diagnosis not present

## 2024-02-29 DIAGNOSIS — Z7189 Other specified counseling: Secondary | ICD-10-CM | POA: Diagnosis not present

## 2024-02-29 DIAGNOSIS — I480 Paroxysmal atrial fibrillation: Secondary | ICD-10-CM | POA: Diagnosis not present

## 2024-02-29 DIAGNOSIS — Z515 Encounter for palliative care: Secondary | ICD-10-CM | POA: Diagnosis not present

## 2024-02-29 MED ORDER — LORAZEPAM 2 MG/ML IJ SOLN: 2.0000 mg | INTRAMUSCULAR | Status: DC | PRN

## 2024-02-29 MED ORDER — SCOPOLAMINE 1 MG/3DAYS TD PT72
1.0000 | MEDICATED_PATCH | TRANSDERMAL | Status: DC
  Administered 2024-02-29: 1 mg via TRANSDERMAL
  Filled 2024-02-29: qty 1

## 2024-02-29 MED ORDER — MORPHINE BOLUS VIA INFUSION
2.0000 mg | INTRAVENOUS | Status: DC | PRN
  Administered 2024-02-29 (×3): 2 mg via INTRAVENOUS

## 2024-02-29 MED ORDER — GLYCOPYRROLATE 0.2 MG/ML IJ SOLN
0.4000 mg | INTRAMUSCULAR | Status: DC
  Administered 2024-02-29 – 2024-03-01 (×5): 0.4 mg via INTRAVENOUS
  Filled 2024-02-29 (×5): qty 2

## 2024-02-29 MED ORDER — LORAZEPAM 2 MG/ML IJ SOLN
1.0000 mg | INTRAMUSCULAR | Status: DC | PRN
  Administered 2024-02-29: 2 mg via INTRAVENOUS
  Filled 2024-02-29: qty 1

## 2024-02-29 NOTE — Progress Notes (Signed)
 Palliative:  HPI: 63 y.o. male with past medical history of CAD s/p PCI 2010 for anterior STEMI, 3 vessel disease s/p CABG 2010, HFrEF 25-30% 2011, ICM, cocaine use admitted on 02/19/2024 with unknown downtime found in Vfib arrest. MRI consistent with anoxic brain injury. Prognosis is poor.   Discussed with my team. Discussed with Dr. Pleas. Due diligence has been completed with successful contact of sister and brother both declining to participate in decision making for [name]. We have identified pastor and friends from the community that have a relationship with [name] through their community resources and shelter. [name] and Raphael Myron are willing to speak on behalf of [name] in the absence of family willing to do so.   Layman and [name] along with multiple other friends and acquaintances of Mr. Rog gather at bedside in preparation for transition to comfort care off of ventilator support. Offered the time to speak and pray in privacy together at bedside.   Notified by friends at bedside that they are prepared for transition off ventilator. I was present along with RN and RT to assist during extubation. Morphine infusion already in place and Ativan provided to assist with smooth transition. [name] had increased secretion burden than expected ultimately requiring multiple morphine bolus to get him more comfortable. Support provided to friends at bedside and explanations provided for signs of comfort and medication provided. Some friends will plan to return to visit over the coming days. Added Nikki to contacts as requested by [name]. They would like updates to [place], [name], or [place] depending on who is available at the time. I did share with them the possibility that he may transfer out of ICU and that any visitors will be notified of room when they check in.   All questions/concerns addressed. Emotional support provided.   Exam: Extubated. Significant secretions with + cough  but unable to expectorate. Breathing labored but regular unlabored after boluses of morphine. Abd soft. Warm to touch.   Plan: - DNR, full comfort care - Anticipate hospital death  87 min  [name], NP Palliative Medicine Team Pager [phone] (Please see [email or web address] for schedule) Team Phone [phone]

## 2024-02-29 NOTE — Progress Notes (Signed)
 Patient arived to 6 N, unresponsive, deep breathing using accessory muscles. Bp 137/73, HR 92, T 99. R 12  02 sat 72% RA, Pupils equally round, non reacted to light. Bowel sound active . Skin warm to touch. Repositioned/turned. Foley cath in place, draining yellow urine,  rectal patch  with no output. Morphine Drip running at 4 ml/hr. Pt appear comfortable.

## 2024-02-29 NOTE — TOC CM/SW Note (Addendum)
 Transition of Care [facility]) - Inpatient Brief Assessment   Patient Details  Name: [name] MRN: [id number] Date of Birth: 1960/08/29  Transition of Care [facility]) CM/SW Contact:    [name], LCSWA Phone Number: 02/29/2024, 9:50 AM   Clinical Narrative:  9:50 AM Medical team informed CSW that patient is anticipated to have a one way extubation today and has transitioned to comfort care. CSW attempted to inform patient's APS social worker, [name], but there was no response and a voicemail was left.  4:23 PM CSW updated APS social worker and homicide archivist.  Transition of Care Asessment: Insurance and Status: Insurance coverage has been reviewed Patient has primary care physician: Yes Home environment has been reviewed: N/A Prior level of function:: N/A Prior/Current Home Services: No current home services Social Drivers of Health Review:  (Patient is currently intubated) Readmission risk has been reviewed: Yes (Currently Yellow 17%) Transition of care needs: transition of care needs identified, TOC will continue to follow

## 2024-02-29 NOTE — Progress Notes (Signed)
 Nutrition Brief Note  Chart reviewed. One-way extubation and now transitioning to comfort care. Tube feeds discontinued on 11/02.  No further nutrition interventions planned at this time. Please re-consult as needed.   Nestora Glatter RD, LDN Clinical Dietitian

## 2024-02-29 NOTE — Progress Notes (Signed)
 eLink Physician-Brief Progress Note Patient Name: [name] DOB: [birth year] MRN: [id number]   Date of Service  02/29/2024  HPI/Events of Note  63 y.o. male with past medical history of CAD s/p PCI 2010 for anterior STEMI, 3 vessel disease s/p CABG 2010, HFrEF 25-30% 2011, ICM, cocaine use admitted on 02/19/2024 with unknown downtime found in Vfib arrest. MRI consistent with anoxic brain injury. Limited beds in ICU, staff requesting a transfer out for ongoing comfort measures  eICU Interventions  Continue comfort measures on MedSurg     Intervention Category Minor Interventions: Routine modifications to care plan (e.g. PRN medications for pain, fever)  Krzysztof Reichelt 02/29/2024, 7:55 PM

## 2024-02-29 NOTE — Progress Notes (Signed)
 NAME:  [name], MRN:  [id number], DOB:  28-Jan-1961, LOS: 10 ADMISSION DATE:  02/19/2024, CONSULTATION DATE:  10/24 REFERRING MD:  Lonni Sakai, MD, CHIEF COMPLAINT:  cardiac arrest   History of Present Illness:  63 y/o male with PMH for CAD who had a PCI in 2010 for anterior STEMI and found to have 3 vessel disease and went for CABG in 2010 (LIMA-LAD, SVG-Ramus, SVG -OM, SVG-RPDA), HFrEF, Ischemic Cardiomyopathy-last echo 2011 showing EF 25-30%, with akinesis to dyskinesis of the entire anterior, anteroseptal, and apical myocardium, and cocaine use disorder who was found down outside [facility] with unknown down time and CPR initiated 1655.  Patient noted to be in V fib arrest.  He was shocked approximately 12 times by EMS, IO placed and 450mg  Amiodarone, 6mg  Epinephrine, 1 liter IVF, 2gm Magnesium.  In ED patient on Levophed drip at 15mcg and Amiodarone drip.  He was intubated and sedated.  Code STEMI called, ST elevations in anteriolat leads and I spoke with Dr End, there is a potential ICH and so there is hesitation to take patient to cath lab given Heparin during cath. LA 9.3, Cr 1.30, WBC 13.8, INR 1.5, Glucose 303.  CT head also showing a soft tissue mass in the left maxillary sinus. Transitioned to comfort care on 11/2 and extubated on 11/3.  Pertinent  Medical History  CAD who had a PCI in 2010 for anterior STEMI and found to have 3 vessel disease and went for CABG in 2010 (LIMA-LAD, SVG-Ramus, SVG -OM, SVG-RPDA), HFrEF, Ischemic Cardiomyopahty-last echo 2011 showing EF 25-30%, with akinesis to dyskinesis of the entire anterior, anteroseptal, and apical myocardium, and cocaine use disorder   Significant Hospital Events: Including procedures, antibiotic start and stop dates in addition to other pertinent events   10/24: Admit to ICU 10.28 MRI with anoxic brain injury 11/2 Family declined to be involved in the patient's care. Friends contacted and  agreed to transition to comfort care 11/3 One way extubation for comfort  Interim History / Subjective:  Patient seen after extubation. Non responsive to voice.  Objective    Blood pressure 124/78, pulse 88, temperature 99.3 F (37.4 C), resp. rate 16, height 6' 2.02 (1.88 m), weight 82.3 kg, SpO2 100%.    Vent Mode: PSV;CPAP FiO2 (%):  [40 %] 40 % Set Rate:  [15 bmp] 15 bmp Vt Set:  [650 mL] 650 mL PEEP:  [5 cmH20] 5 cmH20 Pressure Support:  [10 cmH20] 10 cmH20 Plateau Pressure:  [16 cmH20-19 cmH20] 18 cmH20   Intake/Output Summary (Last 24 hours) at 02/29/2024 1433 Last data filed at 02/29/2024 1027 Gross per 24 hour  Intake 673.37 ml  Output 1065 ml  Net -391.63 ml   Filed Weights   02/26/24 0415 02/27/24 0446 02/28/24 0403  Weight: 81 kg 82 kg 82.3 kg    Examination: General: Appears comfortable, no furrowed brow. Extremities rested HENT: Eccymosis around left eye. Gargling sounds present Lungs: Accessory muscle use. Cardiovascular: Heart sounds present Abdomen: Bowel sounds present. Abdomen non tender Extremities: Extremities resting Neuro: Non-responsive to voice  Resolved problem list  Acute high  gap metabolic acidosis/lactic acidosis Lactic acidosis Hypokalemia Hypocalcemia Cardiogenic shock Thrombocytopenia, possibly due to antibiotics Acute kidney injury due to ischemic ATN postcardiac arrest Assessment and Plan  Resting comfortably after extubation. No visible signs of pain.  Comfort care Patient resting comfortably after extubation. No furrowed brow or any signs of discomfort. -Tylenol  and Morphine for pain control -Ativan  2mg  q2 PRN -Zofran and Scopalomine for nausea -Glycopyroolate q4  Acute encephalopathy, anoxic brain injury confirmed on MRI Myoclonic seizures -Continue keppra -comfort meds as appropriate Acute anterio-lateral STEMI, S/p out-of-hospital V-fib cardiac arrest Acute on chronic HFrEF due to ischemic cardiomyopathy   -Anoxic  brain injury on MRI -Discontinue DAPT, heparin, and amiodarone -Comfort care   Acute respiratory failure with hypoxia on MV Probable aspiration pneumonia-- polymicrobial- citrobacter, MSSA, K pneumoniae -Extubated today -antibiotics discontinued   Hypoglycemia; resolved, now hyperglycemic -Tube feeds off   Forehead ecchymoses, abrasions -wound care   Fevers, suspect central -no response to bromocriptine -tylenol  PRN    Labs   CBC: Recent Labs  Lab 02/23/24 0400 02/24/24 0231 02/25/24 0042  WBC 12.2* 13.9* 12.7*  HGB 12.5* 13.0 13.4  HCT 36.2* 38.1* 38.3*  MCV 87.0 87.8 86.3  PLT 148* 145* 264    Basic Metabolic Panel: Recent Labs  Lab 02/22/24 1700 02/22/24 1700 02/23/24 0400 02/23/24 1620 02/24/24 0231 02/25/24 0240 02/26/24 0623 02/27/24 0656  NA  --    < > 132* 134* 134* 141 147* 151*  K  --    < > 3.3* 3.8 4.3 4.0 3.2* 4.2  CL  --    < > 96* 97* 97* 98 103 110  CO2  --    < > 25 27 28 31 30 27   GLUCOSE  --    < > 251* 136* 122* 136* 135* 167*  BUN  --    < > 50* 46* 45* 47* 41* 33*  CREATININE  --    < > 1.53* 1.44* 1.21 1.03 1.13 0.95  CALCIUM  --    < > 7.6* 7.6* 7.8* 7.9* 7.4* 8.1*  MG 2.0  --  2.2 2.3 2.3 2.4 2.2  --   PHOS 4.5  --  3.7 3.2 2.9  3.0 3.1  --   --    < > = values in this interval not displayed.   GFR: Estimated Creatinine Clearance: 91.3 mL/min (by C-G formula based on SCr of 0.95 mg/dL). Recent Labs  Lab 02/23/24 0400 02/24/24 0231 02/25/24 0042  WBC 12.2* 13.9* 12.7*    Liver Function Tests: Recent Labs  Lab 02/23/24 0400 02/24/24 0231 02/25/24 0240  ALBUMIN 2.2* 2.4* 2.2*   No results for input(s): LIPASE, AMYLASE in the last 168 hours. No results for input(s): AMMONIA in the last 168 hours.  ABG    Component Value Date/Time   PHART 7.405 02/21/2024 1057   PCO2ART 36.0 02/21/2024 1057   PO2ART 91 02/21/2024 1057   HCO3 22.6 02/21/2024 1057   TCO2 24 02/21/2024 1057   ACIDBASEDEF 2.0 02/21/2024 1057    O2SAT 65.9 02/23/2024 0400     Coagulation Profile: No results for input(s): INR, PROTIME in the last 168 hours.  Cardiac Enzymes: No results for input(s): CKTOTAL, CKMB, CKMBINDEX, TROPONINI in the last 168 hours.  HbA1C: Hgb A1c MFr Bld  Date/Time Value Ref Range Status  02/19/2024 11:30 PM 5.1 4.8 - 5.6 % Final    Comment:    (NOTE) Diagnosis of Diabetes The following HbA1c ranges recommended by the American Diabetes Association (ADA) may be used as an aid in the diagnosis of diabetes mellitus.  Hemoglobin             Suggested A1C NGSP%              Diagnosis  <5.7  Non Diabetic  5.7-6.4                Pre-Diabetic  >6.4                   Diabetic  <7.0                   Glycemic control for                       adults with diabetes.    02/22/2009 04:00 AM  4.6 - 6.1 % Final   5.3 (NOTE) The ADA recommends the following therapeutic goal for glycemic control related to Hgb A1c measurement: Goal of therapy: <6.5 Hgb A1c  Reference: American Diabetes Association: Clinical Practice Recommendations 2010, Diabetes Care, 2010, 33: (Suppl  1).    CBG: Recent Labs  Lab 02/27/24 2312 02/28/24 0323 02/28/24 0806 02/28/24 1127 02/28/24 1545  GLUCAP 173* 158* 160* 126* 146*    Past Medical History:  He,  has a past medical history of CAD (coronary artery disease), Cocaine abuse (HCC), Heart disease, and CABG.   Surgical History:   Past Surgical History:  Procedure Laterality Date   CORONARY STENT PLACEMENT       Social History:   reports that he has been smoking. He does not have any smokeless tobacco history on file. He reports current drug use. Drug: Cocaine. He reports that he does not drink alcohol.   Family History:  His family history is not on file.   Allergies No Known Allergies   Home Medications  Prior to Admission medications   Medication Sig Start Date End Date Taking? Authorizing Provider  amoxicillin   (AMOXIL ) 500 MG capsule Take 1,000 mg by mouth 2 (two) times daily.    [provider]  aspirin EC 81 MG tablet Take 81 mg by mouth daily.      [provider]  digoxin (LANOXIN) 0.125 MG tablet Take 125 mcg by mouth daily.      [provider]  fluticasone  (FLONASE ) 50 MCG/ACT nasal spray Place 2 sprays into the nose daily.    [provider]  lidocaine  (LIDODERM ) 5 % Place 1 patch onto the skin daily. Remove & Discard patch within 12 hours or as directed by MD 12/29/19   Joy, Shawn C, PA-C  lisinopril (PRINIVIL,ZESTRIL) 5 MG tablet Take 5 mg by mouth daily.      [provider]  loratadine  (CLARITIN ) 10 MG tablet Take 10 mg by mouth daily.    [provider]  methocarbamol  (ROBAXIN ) 750 MG tablet Take 1 tablet (750 mg total) by mouth 2 (two) times daily as needed for muscle spasms (or muscle tightness). 12/29/19   Joy, Shawn C, PA-C  metoprolol tartrate (LOPRESSOR) 25 MG tablet Take 25 mg by mouth 2 (two) times daily.      [provider]  pravastatin (PRAVACHOL) 20 MG tablet Take 20 mg by mouth at bedtime.      [provider]

## 2024-02-29 NOTE — Progress Notes (Signed)
 RT NOTE: PT extubated to comfort care per order and family wishes.

## 2024-03-01 MED ORDER — MORPHINE BOLUS VIA INFUSION: 4.0000 mg | INTRAVENOUS | Status: DC | PRN

## 2024-03-01 MED ORDER — LORAZEPAM 2 MG/ML IJ SOLN
2.0000 mg | INTRAMUSCULAR | Status: DC | PRN
Start: 2024-03-01 — End: 2024-03-01

## 2024-03-01 MED ORDER — LORAZEPAM 2 MG/ML IJ SOLN
2.0000 mg | INTRAMUSCULAR | Status: DC
  Administered 2024-03-01: 2 mg via INTRAVENOUS
  Filled 2024-03-01: qty 1

## 2024-03-28 NOTE — Progress Notes (Signed)
 Palliative:  HPI: 63 y.o. male with past medical history of CAD s/p PCI 2010 for anterior STEMI, 3 vessel disease s/p CABG 2010, HFrEF 25-30% 2011, ICM, cocaine use admitted on 02/19/2024 with unknown downtime found in Vfib arrest. MRI consistent with anoxic brain injury. Prognosis is poor.   I met this morning at [name]'s bedside. He is unresponsive but with tachypnea and dyspnea. I provided morphine bolus and added scheduled Ativan pushes to provide improved symptom relief and make him more comfortable. No visitors to bedside. Discussed with RN.   Update: I returned to the bedside. Mr. Biskup is more comfortable than my previous visit but still with some labored breathes. I provided another dose of morphine. I returned after ~10-15 minutes to reassess efficacy of bolus and Mr. Veasey was found without pulse, respirations. Assessed with Herma Bunker, RN and time of death 73.   I called and discussed with [name] as well as Levon Birmingham. Condolences provided.   Notified Dr. Fairy.   45 min  [name], NP Palliative Medicine Team Pager [phone] (Please see [email or web address] for schedule) Team Phone [phone]

## 2024-03-28 NOTE — Discharge Summary (Signed)
 Death Summary  [name] FMW:[id number] DOB: 1960-12-15 DOA: Feb 29, 2024  PCP: Scarlett Ronal Caldron, NP   Admit date: 2024/02/29 Date of Death: 2024-03-11  Final Diagnoses:  Principal Problem:   Acute HFrEF (heart failure with reduced ejection fraction) (HCC)   Cardiac arrest (HCC) Anoxic encephalopathy Cocaine abuse STEMI Chronic systolic CHF CAD/CABG   Acute respiratory failure with hypoxia (HCC)   On mechanically assisted ventilation (HCC)   AKI (acute kidney injury)   Paroxysmal atrial fibrillation (HCC)   Malnutrition of moderate degree     History of present illness:  63 y/o male with PMH for CAD who had a PCI in 2010 for anterior STEMI and found to have 3 vessel disease and went for CABG in 2010 (LIMA-LAD, SVG-Ramus, SVG -OM, SVG-RPDA), HFrEF, Ischemic Cardiomyopathy-last echo 2011 showing EF 25-30%, with akinesis to dyskinesis of the entire anterior, anteroseptal, and apical myocardium, and cocaine use disorder who was [facility] with unknown down time and CPR initiated 1655.  Patient noted to be in V fib arrest.  He was shocked approximately 12 times by EMS, IO placed and 450mg  Amiodarone, 6mg  Epinephrine, 1 liter IVF, 2gm Magnesium.  In ED patient on Levophed drip at 15mcg and Amiodarone drip.  He was intubated and sedated.  Code STEMI called, ST elevations in anteriolat leads and I spoke with Dr End, there is a potential ICH and so there is hesitation to take patient to cath lab given Heparin during cath. LA 9.3, Cr 1.30, WBC 13.8, INR 1.5, Glucose 303.  CT head also showing a soft tissue mass in the left maxillary sinus. Transitioned to comfort care on 11/2 and extubated on 11/3.  Hospital Course:  62/M with CAD, PCI, CABG, chronic systolic CHF, polysubstance abuse, cocaine abuse was admitted following a cardiac arrest outside [facility] noted to be in V-fib arrest, shocked 12 times by EMS, placed on Amio epinephrine  magnesium, admitted to the ICU intubated, treated with Levophed and Amio gtt., EKG noted code STEMI, ST elevation in anterior leads, discussed with cardiology, not stable for left heart cath. - Admitted to the ICU on the ventilator with anoxic encephalopathy, shock -10/28 MRI noted anoxic brain injury -11/2, family(brother and sister) declined involvement in patient's care, friends and his pastor were contacted by critical care team in ICU, agreed to transition him to comfort care -11/3, one-way extubation was performed for comfort -2024/03/11, transferred from [facility] [place] to [facility] service for comfort care and expired   Time:  Signed:  Sigurd Pac  [facility] 03/02/2024, 11:30 AM

## 2024-03-28 NOTE — Progress Notes (Signed)
 Medical Examiner Morna Kerns notified about pt passing. ME still reviewing chart to see if pt is eligible to be ME case. Pt being taken to morgue now. ME notified and stated he would follow up later and would update chart.

## 2024-03-28 NOTE — Progress Notes (Signed)
 Patient seen and examined, 62/M with CAD, PCI, CABG, chronic systolic CHF, polysubstance abuse, cocaine abuse was admitted following a cardiac arrest outside [facility] noted to be in V-fib arrest, shocked 12 times by EMS, placed on Amio epinephrine magnesium, admitted to the ICU intubated, treated with Levophed and Amio gtt., EKG noted code STEMI, ST elevation in anterior leads, discussed with cardiology, not stable for left heart cath. - Admitted to the ICU on the ventilator with anoxic encephalopathy, shock -10/28 MRI noted anoxic brain injury -11/2, family(brother and sister) declined involvement in patient's care, friends and his pastor were contacted by critical care team in ICU, agreed to transition him to comfort care -11/3, one-way extubation was performed for comfort -11/4, transferred to [facility] service for comfort care  Patient seen, remains unresponsive on morphine gtt. Anticipate hospital demise  Sigurd Pac, MD

## 2024-03-28 NOTE — TOC CM/SW Note (Signed)
Transition of Care [facility]) - Inpatient Brief Assessment   Patient Details  Name: [name] MRN: [id number] Date of Birth: 06/22/1960  Transition of Care [facility]) CM/SW Contact:    [name], LCSWA Phone Number: 02/29/2024, 10:53 AM   Clinical Narrative:  10:53 AM Palliative Care NP informed CSW of patient's time of death (today at 10:14AM). CSW informed APS social worker and homicide detective of patient's time of death.  Transition of Care Asessment: Insurance and Status: Insurance coverage has been reviewed Patient has primary care physician: Yes Home environment has been reviewed: N/A Prior level of function:: N/A Prior/Current Home Services: No current home services Social Drivers of Health Review:  (Patient is currently intubated) Readmission risk has been reviewed: Yes (Currently Yellow 17%) Transition of care needs: no transition of care needs at this time

## 2024-03-28 NOTE — Progress Notes (Signed)
 Pt morphine increased to 6 @0915  per order. Additional Bolus given by Bernarda JAYSON Kennyth CARROLYN after increase do to pt being in discomfort.

## 2024-03-28 DEATH — deceased
